# Patient Record
Sex: Female | Born: 1985
Health system: Southern US, Community
[De-identification: ages and names within clinical notes are randomized; demographics above are authoritative.]

---

## 2017-09-01 DIAGNOSIS — Z8481 Family history of carrier of genetic disease: Secondary | ICD-10-CM | POA: Diagnosis not present

## 2017-09-01 DIAGNOSIS — Z31430 Encounter of female for testing for genetic disease carrier status for procreative management: Secondary | ICD-10-CM | POA: Diagnosis not present

## 2019-06-02 DIAGNOSIS — Z0000 Encounter for general adult medical examination without abnormal findings: Secondary | ICD-10-CM | POA: Diagnosis not present

## 2019-06-02 DIAGNOSIS — E663 Overweight: Secondary | ICD-10-CM | POA: Diagnosis not present

## 2019-06-29 DIAGNOSIS — E663 Overweight: Secondary | ICD-10-CM | POA: Diagnosis not present

## 2019-06-29 DIAGNOSIS — M549 Dorsalgia, unspecified: Secondary | ICD-10-CM | POA: Diagnosis not present

## 2019-09-08 DIAGNOSIS — Z23 Encounter for immunization: Secondary | ICD-10-CM | POA: Diagnosis not present

## 2019-09-08 DIAGNOSIS — E782 Mixed hyperlipidemia: Secondary | ICD-10-CM | POA: Diagnosis not present

## 2019-09-08 DIAGNOSIS — Z6825 Body mass index (BMI) 25.0-25.9, adult: Secondary | ICD-10-CM | POA: Diagnosis not present

## 2019-09-18 DIAGNOSIS — Z03818 Encounter for observation for suspected exposure to other biological agents ruled out: Secondary | ICD-10-CM | POA: Diagnosis not present

## 2020-07-18 DIAGNOSIS — E782 Mixed hyperlipidemia: Secondary | ICD-10-CM | POA: Diagnosis not present

## 2020-11-08 ENCOUNTER — Other Ambulatory Visit: Payer: Self-pay

## 2020-11-08 ENCOUNTER — Ambulatory Visit (INDEPENDENT_AMBULATORY_CARE_PROVIDER_SITE_OTHER): Payer: Self-pay

## 2020-11-08 DIAGNOSIS — Z23 Encounter for immunization: Secondary | ICD-10-CM

## 2021-01-15 DIAGNOSIS — F411 Generalized anxiety disorder: Secondary | ICD-10-CM | POA: Diagnosis not present

## 2021-02-19 DIAGNOSIS — F411 Generalized anxiety disorder: Secondary | ICD-10-CM | POA: Diagnosis not present

## 2021-05-21 ENCOUNTER — Emergency Department (HOSPITAL_COMMUNITY): Payer: BLUE CROSS/BLUE SHIELD

## 2021-05-21 ENCOUNTER — Emergency Department (HOSPITAL_COMMUNITY)
Admission: EM | Admit: 2021-05-21 | Discharge: 2021-05-23 | Disposition: A | Discharge status: Home / Self Care | Payer: BLUE CROSS/BLUE SHIELD | Attending: Emergency Medicine | Admitting: Emergency Medicine

## 2021-05-21 ENCOUNTER — Encounter (HOSPITAL_COMMUNITY): Payer: Self-pay | Admitting: *Deleted

## 2021-05-21 ENCOUNTER — Ambulatory Visit (HOSPITAL_COMMUNITY)
Admission: AD | Admit: 2021-05-21 | Discharge: 2021-05-21 | Disposition: A | Discharge status: Home / Self Care | Payer: BC Managed Care – PPO | Attending: Psychiatry | Admitting: Psychiatry

## 2021-05-21 DIAGNOSIS — F29 Unspecified psychosis not due to a substance or known physiological condition: Secondary | ICD-10-CM | POA: Insufficient documentation

## 2021-05-21 DIAGNOSIS — F333 Major depressive disorder, recurrent, severe with psychotic symptoms: Secondary | ICD-10-CM | POA: Insufficient documentation

## 2021-05-21 DIAGNOSIS — I639 Cerebral infarction, unspecified: Secondary | ICD-10-CM | POA: Diagnosis not present

## 2021-05-21 DIAGNOSIS — N9489 Other specified conditions associated with female genital organs and menstrual cycle: Secondary | ICD-10-CM | POA: Diagnosis not present

## 2021-05-21 DIAGNOSIS — G9340 Encephalopathy, unspecified: Secondary | ICD-10-CM | POA: Diagnosis not present

## 2021-05-21 DIAGNOSIS — R000 Tachycardia, unspecified: Secondary | ICD-10-CM | POA: Diagnosis not present

## 2021-05-21 DIAGNOSIS — B9689 Other specified bacterial agents as the cause of diseases classified elsewhere: Secondary | ICD-10-CM | POA: Diagnosis not present

## 2021-05-21 DIAGNOSIS — E860 Dehydration: Secondary | ICD-10-CM | POA: Insufficient documentation

## 2021-05-21 DIAGNOSIS — N390 Urinary tract infection, site not specified: Secondary | ICD-10-CM | POA: Diagnosis not present

## 2021-05-21 DIAGNOSIS — Z20822 Contact with and (suspected) exposure to covid-19: Secondary | ICD-10-CM | POA: Diagnosis not present

## 2021-05-21 DIAGNOSIS — F419 Anxiety disorder, unspecified: Secondary | ICD-10-CM | POA: Diagnosis not present

## 2021-05-21 DIAGNOSIS — R0789 Other chest pain: Secondary | ICD-10-CM | POA: Diagnosis not present

## 2021-05-21 DIAGNOSIS — R079 Chest pain, unspecified: Secondary | ICD-10-CM

## 2021-05-21 DIAGNOSIS — G4700 Insomnia, unspecified: Secondary | ICD-10-CM | POA: Diagnosis not present

## 2021-05-21 LAB — CBC WITH DIFFERENTIAL/PLATELET
Abs Immature Granulocytes: 0.02 10*3/uL (ref 0.00–0.07)
Basophils Absolute: 0 10*3/uL (ref 0.0–0.1)
Basophils Relative: 0 %
Eosinophils Absolute: 0 10*3/uL (ref 0.0–0.5)
Eosinophils Relative: 0 %
HCT: 42.9 % (ref 36.0–46.0)
Hemoglobin: 14.4 g/dL (ref 12.0–15.0)
Immature Granulocytes: 0 %
Lymphocytes Relative: 14 %
Lymphs Abs: 1.3 10*3/uL (ref 0.7–4.0)
MCH: 31.1 pg (ref 26.0–34.0)
MCHC: 33.6 g/dL (ref 30.0–36.0)
MCV: 92.7 fL (ref 80.0–100.0)
Monocytes Absolute: 0.7 10*3/uL (ref 0.1–1.0)
Monocytes Relative: 8 %
Neutro Abs: 7.5 10*3/uL (ref 1.7–7.7)
Neutrophils Relative %: 78 %
Platelets: 174 10*3/uL (ref 150–400)
RBC: 4.63 MIL/uL (ref 3.87–5.11)
RDW: 11.9 % (ref 11.5–15.5)
WBC: 9.7 10*3/uL (ref 4.0–10.5)
nRBC: 0 % (ref 0.0–0.2)

## 2021-05-21 LAB — COMPREHENSIVE METABOLIC PANEL
ALT: 13 U/L (ref 0–44)
AST: 15 U/L (ref 15–41)
Albumin: 4.1 g/dL (ref 3.5–5.0)
Alkaline Phosphatase: 36 U/L — ABNORMAL LOW (ref 38–126)
Anion gap: 11 (ref 5–15)
BUN: 11 mg/dL (ref 6–20)
CO2: 21 mmol/L — ABNORMAL LOW (ref 22–32)
Calcium: 9.5 mg/dL (ref 8.9–10.3)
Chloride: 104 mmol/L (ref 98–111)
Creatinine, Ser: 0.8 mg/dL (ref 0.44–1.00)
GFR, Estimated: >60 mL/min (ref >60)
Glucose, Bld: 104 mg/dL — ABNORMAL HIGH (ref 70–99)
Potassium: 3.4 mmol/L — ABNORMAL LOW (ref 3.5–5.1)
Sodium: 136 mmol/L (ref 135–145)
Total Bilirubin: 1.5 mg/dL — ABNORMAL HIGH (ref 0.3–1.2)
Total Protein: 7.1 g/dL (ref 6.5–8.1)

## 2021-05-21 LAB — TROPONIN I (HIGH SENSITIVITY): Troponin I (High Sensitivity): 2 ng/L (ref <18)

## 2021-05-21 NOTE — BH Assessment (Addendum)
TTS note:   Kerry Dixon is a 35 y.o. female that presents to [facility] as a walk-in with her mother Cheryln Balcom). Clinician and [facility] provider Clydie Braun Dolby,NP went to see patient together to complete her TTS assessment. Patient's chief complaint was reported as "Chest Pain and not sleeping x4 days". The [facility] provider stepped in to address patient's medical concerns and/or complaints of "chest pain". Following the [facility] providers assessment of medical concerns it was determined that patient needed to to be referred to the ED for medical clearance. The provided discussed rationale and need for medical clearance. Patient and mother agreed. Patient transferred to [facility] for medical clearance. No TTS completed at this time as patient's medical concerns are more prevalent and will need to be addressed further.

## 2021-05-21 NOTE — H&P (Signed)
Behavioral Health Medical Screening Exam  Kerry Dixon is a 35 y.o. female seen by this provider with TTS counselor as voluntary walk-in at [facility] accompanied by her mother, Jasmine December. Mother is present during exam.  Patient is sitting calmly in exam room, cooperative with exam. When asked what brings her in today, she replies, "my chest is hurting and I haven't slept in 4 days" Mother reports patient was seen at her PCP today for chest pain with no work up. Symptoms include: abdominal pain, chest pain, exhaustion, night terrors and visions of people. PCP did not perform EKG or lab work today. Psychiatric evaluation stopped to discuss needing to get patient medically cleared.   Total Time spent with patient: 30 minutes  Psychiatric Specialty Exam:  Presentation  General Appearance:  Appropriate for Environment Eye Contact: Fair Speech: Clear and Coherent; Normal Rate Speech Volume: Normal Handedness: No data recorded  Mood and Affect  Mood: Anxious Affect: Congruent  Thought Process  Thought Processes: Coherent; Goal Directed Descriptions of Associations:Intact Orientation:Full (Time, Place and Person) Thought Content:Logical History of Schizophrenia/Schizoaffective disorder:No data recorded Duration of Psychotic Symptoms:No data recorded Hallucinations:Hallucinations: -- (full psychiatric evaluation on hold due to medical needs) Ideas of Reference:No data recorded Suicidal Thoughts:No data recorded Homicidal Thoughts:No data recorded  Sensorium  Memory: No data recorded Judgment: No data recorded Insight: No data recorded  Executive Functions  Concentration: No data recorded Attention Span: No data recorded Recall: No data recorded Fund of Knowledge: No data recorded Language: No data recorded  Psychomotor Activity  Psychomotor Activity: No data recorded  Assets  Assets: No data recorded  Sleep  Sleep: No data recorded   Physical Exam: Physical  Exam Constitutional:      General: She is not in acute distress. Musculoskeletal:        General: Normal range of motion.  Skin:    General: Skin is warm and dry.  Neurological:     Mental Status: She is alert and oriented to person, place, and time.  Psychiatric:        Attention and Perception: Attention normal.        Mood and Affect: Mood is anxious.        Speech: Speech normal.        Behavior: Behavior is cooperative.   Review of Systems  Constitutional:  Negative for chills and fever.  Respiratory:  Negative for shortness of breath.   Cardiovascular:  Positive for chest pain.  Gastrointestinal:  Positive for abdominal pain. Negative for nausea and vomiting.  Neurological:  Negative for weakness and headaches.  Blood pressure 122/89, pulse 98, temperature 98.4 F (36.9 C), temperature source Oral, resp. rate 16, SpO2 99 %. There is no height or weight on file to calculate BMI.  Musculoskeletal: Strength & Muscle Tone: within normal limits Gait & Station: normal Patient leans: N/A   Recommendations:  Based on my evaluation the patient appears to have an emergency medical condition for which I recommend the patient be transferred to the emergency department for further evaluation. Due to possible cardiac involvement, patient will be transported via safe transport to [facility] hospital. Charge nurse notified and Dr. Particia Nearing, EDP aware and accepts patient. Once medically cleared, please place TTS consult for full psychiatric evaluation. Patient and mother agree with plan to go to [facility] for medical evaluation. EMTALA form completed.   Novella Olive, NP 05/21/2021, 5:23 PM

## 2021-05-21 NOTE — ED Provider Notes (Addendum)
Emergency Medicine Provider Triage Evaluation Note  Kerry Dixon , a 35 y.o. female  was evaluated in triage.  Pt complains of "feeling very tired" since Sunday. She endorses runny nose, sore throat, nausea, diarrhea, abdominal pain. She says yes to chest pain, denies vomiting. Patient reports she takes some medication for anxiety and took two today.   Addendum: Patient was apparently sent from [facility] for medical clearance because she endorses chest pain, shortness of breath.  Review of Systems  Positive: As above Negative: As above  Physical Exam  BP 121/90 (BP Location: Right Arm)   Pulse 91   Temp 99.5 F (37.5 C) (Oral)   Resp 16   SpO2 98%  Gen:   Awake, no distress, somnolent Resp:  Normal effort MSK:   Moves extremities without difficulty Other:  No/minimal TTP of abdomen  Medical Decision Making  Medically screening exam initiated at 6:48 PM.  Appropriate orders placed.  Kerry Dixon was informed that the remainder of the evaluation will be completed by another provider, this initial triage assessment does not replace that evaluation, and the importance of remaining in the ED until their evaluation is complete.  fatigue   Olene Floss, PA-C 05/21/21 1850    Kerry Dixon 05/21/21 1904    Pollyann Savoy, MD 05/22/21 [phone]

## 2021-05-21 NOTE — ED Triage Notes (Signed)
Tired and sleepy since  Sunday

## 2021-05-21 NOTE — ED Triage Notes (Signed)
Most of the information not completed  the pt is not answering any questions asked  unable to complete triage

## 2021-05-21 NOTE — ED Triage Notes (Signed)
Pt is not answering questions she told the pa that she is hurting all over  no answers to questions asked sleeps  lmp  no answer

## 2021-05-21 NOTE — ED Notes (Signed)
Pt will not sit still and does not want vitals done

## 2021-05-22 ENCOUNTER — Emergency Department (HOSPITAL_COMMUNITY): Payer: BLUE CROSS/BLUE SHIELD

## 2021-05-22 DIAGNOSIS — G9340 Encephalopathy, unspecified: Secondary | ICD-10-CM | POA: Diagnosis not present

## 2021-05-22 DIAGNOSIS — I639 Cerebral infarction, unspecified: Secondary | ICD-10-CM | POA: Diagnosis not present

## 2021-05-22 LAB — SALICYLATE LEVEL: Salicylate Lvl: <7 mg/dL — ABNORMAL LOW (ref 7.0–30.0)

## 2021-05-22 LAB — URINALYSIS, ROUTINE W REFLEX MICROSCOPIC
Bilirubin Urine: NEGATIVE
Glucose, UA: NEGATIVE mg/dL
Hgb urine dipstick: NEGATIVE
Ketones, ur: 80 mg/dL — AB
Nitrite: NEGATIVE
Protein, ur: 30 mg/dL — AB
Specific Gravity, Urine: 1.027 (ref 1.005–1.030)
pH: 5 (ref 5.0–8.0)

## 2021-05-22 LAB — RESP PANEL BY RT-PCR (FLU A&B, COVID) ARPGX2
Influenza A by PCR: NEGATIVE
Influenza B by PCR: NEGATIVE
SARS Coronavirus 2 by RT PCR: NEGATIVE

## 2021-05-22 LAB — TSH: TSH: 1.221 u[IU]/mL (ref 0.350–4.500)

## 2021-05-22 LAB — ACETAMINOPHEN LEVEL: Acetaminophen (Tylenol), Serum: <10 ug/mL — ABNORMAL LOW (ref 10–30)

## 2021-05-22 LAB — ETHANOL: Alcohol, Ethyl (B): <10 mg/dL (ref <10)

## 2021-05-22 LAB — TROPONIN I (HIGH SENSITIVITY): Troponin I (High Sensitivity): 3 ng/L (ref <18)

## 2021-05-22 LAB — POC URINE PREG, ED: Preg Test, Ur: NEGATIVE

## 2021-05-22 MED ORDER — CEPHALEXIN 250 MG PO CAPS
500.0000 mg | ORAL_CAPSULE | Freq: Two times a day (BID) | ORAL | Status: DC
  Administered 2021-05-22 (×2): 500 mg via ORAL
  Filled 2021-05-22 (×2): qty 2

## 2021-05-22 MED ORDER — ZIPRASIDONE MESYLATE 20 MG IM SOLR
INTRAMUSCULAR | Status: AC
  Administered 2021-05-22: 20 mg via INTRAMUSCULAR
  Filled 2021-05-22: qty 20

## 2021-05-22 MED ORDER — SODIUM CHLORIDE 0.9 % IV BOLUS
1000.0000 mL | Freq: Once | INTRAVENOUS | Status: AC
  Administered 2021-05-22: 1000 mL via INTRAVENOUS

## 2021-05-22 MED ORDER — SODIUM CHLORIDE 0.9 % IV SOLN
1.0000 g | Freq: Once | INTRAVENOUS | Status: AC
  Administered 2021-05-22: 1 g via INTRAVENOUS
  Filled 2021-05-22: qty 10

## 2021-05-22 MED ORDER — MELATONIN 3 MG PO TABS
3.0000 mg | ORAL_TABLET | Freq: Every day | ORAL | Status: DC
  Administered 2021-05-22: 3 mg via ORAL
  Filled 2021-05-22: qty 1

## 2021-05-22 MED ORDER — STERILE WATER FOR INJECTION IJ SOLN
INTRAMUSCULAR | Status: AC
  Administered 2021-05-22: 1 mL via INTRAMUSCULAR
  Filled 2021-05-22: qty 10

## 2021-05-22 MED ORDER — HYDROXYZINE HCL 10 MG PO TABS
10.0000 mg | ORAL_TABLET | Freq: Two times a day (BID) | ORAL | Status: DC
  Administered 2021-05-22: 10 mg via ORAL
  Filled 2021-05-22 (×3): qty 1

## 2021-05-22 NOTE — Progress Notes (Addendum)
Inpatient Behavioral Health Placement.   Pt meets inpatient criteria per [facility].  Referral was sent to the following facilities:  Destination Service Provider Address Phone Fax  [facility]  [address] [place] [address] [phone]  [facility]  [address] [place] [address] [phone]  [facility]  [address] [place] [address] [phone]  [facility]  [address], [place] [address] [phone]  [facility]  [address] [place] [address] [phone]  [facility]  [address] [place], [place] [address] [phone]  [facility]  [address] [place] [address] [phone]  [facility]  [address], [place] [address] [phone]  [facility]  [address] [place] [address] [phone]  [facility]  [address], [place] [address] [phone]  [facility]  [address] [place] [address] [phone]    Situation ongoing,  CSW will follow up.   Maryjean Ka, MSW, [facility] 05/22/2021  @ 6:49 PM

## 2021-05-22 NOTE — ED Notes (Signed)
Pts AD placed with other belongings, not completed as witnesses are not present. Pt verbalized need for something to help her sleep. This RN spoke with Wilkie Aye, MD about this issue.

## 2021-05-22 NOTE — ED Notes (Signed)
Chaplain at bedside

## 2021-05-22 NOTE — ED Notes (Signed)
Pt changed into purple scrubs. Belongings inventoried and placed in locker #4

## 2021-05-22 NOTE — ED Notes (Signed)
Pt up to bathroom with sister.

## 2021-05-22 NOTE — ED Notes (Signed)
Pt asking to speak with chaplain, this RN notified chaplain of desire to do so.

## 2021-05-22 NOTE — ED Notes (Signed)
This RN rounded on pt, pt stated she just felt tired.

## 2021-05-22 NOTE — ED Provider Notes (Signed)
[facility] EMERGENCY DEPARTMENT Provider Note   CSN: [id number] Arrival date & time: 05/21/21  1829     History Chief Complaint  Patient presents with   Fatigue    Kerry Dixon is a 35 y.o. female presents to the emergency department accompanied by her sister from [facility].  Sister reports that patient has been anxious and not sleeping for the last 4 nights.  Patient saw her primary care physician in April for anxiety and was prescribed BuSpar.  Patient reports she did not take any of that medication until 4 days ago and has not taken any since.  Patient had a follow-up with her primary care this morning and was given hydroxyzine with a plan to stop the BuSpar.  Patient has had 2 tablets of hydroxyzine without improvement in anxiety.  Patient was taken to [facility] for further evaluation but complained of chest pain there.  They deferred psychiatric evaluation until patient could be medically cleared.  Patient reports that her chest feels tight but is not painful.  She reports that she feels anxious and very tired and is hungry.  She does not seem to be aware of her situation.  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  Denies homicidal ideation or suicidal ideation.  Patient's sister reports that she has been having some potential hallucinations and is paranoid that other people are getting hurt.  No known aggravating or alleviating factors.  Denies drug and alcohol usage.    The history is provided by the patient and medical records. No language interpreter was used.      History reviewed. No pertinent past medical history.  There are no problems to display for this patient.   History reviewed. No pertinent surgical history.   OB History   No obstetric history on file.     History reviewed. No pertinent family history.     Home Medications Prior to Admission medications   Medication Sig Start Date End Date  Taking? Authorizing Provider  ASHWAGANDHA PO Take 1 capsule by mouth daily.   Yes [provider]  hydrOXYzine (ATARAX/VISTARIL) 10 MG tablet Take 10 mg by mouth 2 (two) times daily. 05/21/21  Yes [provider]  buPROPion (WELLBUTRIN) 75 MG tablet Take 75 mg by mouth 2 (two) times daily. Patient not taking: No sig reported 02/19/21   [provider]    Allergies    Patient has no known allergies.  Review of Systems   Review of Systems  Constitutional:  Positive for fatigue. Negative for appetite change, diaphoresis, fever and unexpected weight change.  HENT:  Negative for mouth sores.   Eyes:  Negative for visual disturbance.  Respiratory:  Positive for chest tightness. Negative for cough, shortness of breath and wheezing.   Cardiovascular:  Positive for palpitations. Negative for chest pain.  Gastrointestinal:  Negative for abdominal pain, constipation, diarrhea, nausea and vomiting.  Endocrine: Negative for polydipsia, polyphagia and polyuria.  Genitourinary:  Negative for dysuria, frequency, hematuria and urgency.  Musculoskeletal:  Negative for back pain and neck stiffness.  Skin:  Negative for rash.  Allergic/Immunologic: Negative for immunocompromised state.  Neurological:  Negative for syncope, light-headedness and headaches.  Hematological:  Does not bruise/bleed easily.  Psychiatric/Behavioral:  Positive for decreased concentration and sleep disturbance. The patient is nervous/anxious.    Physical Exam Updated Vital Signs BP 118/70   Pulse (!) 107   Temp 99.5 F (37.5 C) (  Oral)   Resp 18   Ht 5\' 4"  (1.626 m)   Wt 59 kg   SpO2 98%   BMI 22.31 kg/m   Physical Exam Vitals and nursing note reviewed.  Constitutional:      General: She is not in acute distress.    Appearance: She is not diaphoretic.  HENT:     Head: Normocephalic.  Eyes:     General: No scleral icterus.    Conjunctiva/sclera: Conjunctivae normal.  Cardiovascular:      Rate and Rhythm: Regular rhythm. Tachycardia present.     Pulses: Normal pulses.          Radial pulses are 2+ on the right side and 2+ on the left side.  Pulmonary:     Effort: No tachypnea, accessory muscle usage, prolonged expiration, respiratory distress or retractions.     Breath sounds: No stridor.     Comments: Equal chest rise. No increased work of breathing. Abdominal:     General: There is no distension.     Palpations: Abdomen is soft.     Tenderness: There is no abdominal tenderness. There is no guarding or rebound.  Musculoskeletal:     Cervical back: Normal range of motion.     Comments: Moves all extremities equally and without difficulty.  Skin:    General: Skin is warm and dry.     Capillary Refill: Capillary refill takes less than 2 seconds.  Neurological:     Mental Status: She is alert.     GCS: GCS eye subscore is 4. GCS verbal subscore is 5. GCS motor subscore is 6.     Comments: Speech is clear and goal oriented.  Psychiatric:        Mood and Affect: Mood is anxious.        Behavior: Behavior is cooperative.        Judgment: Judgment is impulsive.    ED Results / Procedures / Treatments   Labs (all labs ordered are listed, but only abnormal results are displayed) Labs Reviewed  COMPREHENSIVE METABOLIC PANEL - Abnormal; Notable for the following components:      Result Value   Potassium 3.4 (*)    CO2 21 (*)    Glucose, Bld 104 (*)    Alkaline Phosphatase 36 (*)    Total Bilirubin 1.5 (*)    All other components within normal limits  ACETAMINOPHEN LEVEL - Abnormal; Notable for the following components:   Acetaminophen (Tylenol), Serum <10 (*)    All other components within normal limits  SALICYLATE LEVEL - Abnormal; Notable for the following components:   Salicylate Lvl <7.0 (*)    All other components within normal limits  URINALYSIS, ROUTINE W REFLEX MICROSCOPIC - Abnormal; Notable for the following components:   APPearance HAZY (*)    Ketones,  ur 80 (*)    Protein, ur 30 (*)    Leukocytes,Ua MODERATE (*)    Bacteria, UA RARE (*)    All other components within normal limits  RESP PANEL BY RT-PCR (FLU A&B, COVID) ARPGX2  CBC WITH DIFFERENTIAL/PLATELET  ETHANOL  TSH  RAPID URINE DRUG SCREEN, HOSP PERFORMED  I-STAT BETA HCG BLOOD, ED ([facility], [facility], [facility] ONLY)  TROPONIN I (HIGH SENSITIVITY)  TROPONIN I (HIGH SENSITIVITY)    EKG EKG Interpretation  Date/Time:  Wednesday May 21 2021 19:08:11 EDT Ventricular Rate:  88 PR Interval:  110 QRS Duration: 62 QT Interval:  366 QTC Calculation: 442 R Axis:   27 Text Interpretation: Sinus  rhythm with sinus arrhythmia Confirmed by Nicanor Alcon, April ([id number]) on 05/21/2021 11:28:01 PM  Radiology DG Chest 1 View  Result Date: 05/21/2021 CLINICAL DATA:  Chest pain. EXAM: CHEST  1 VIEW COMPARISON:  None. FINDINGS: The heart size and mediastinal contours are within normal limits. Both lungs are clear. The visualized skeletal structures are unremarkable. IMPRESSION: No active disease. Electronically Signed   By: Aram Candela M.D.   On: 05/21/2021 20:54   CT HEAD WO CONTRAST ( )  Result Date: 05/22/2021 CLINICAL DATA:  Encephalopathy EXAM: CT HEAD WITHOUT CONTRAST TECHNIQUE: Contiguous axial images were obtained from the base of the skull through the vertex without intravenous contrast. COMPARISON:  None. FINDINGS: Brain: There is no mass, hemorrhage or extra-axial collection. The size and configuration of the ventricles and extra-axial CSF spaces are normal. The brain parenchyma is normal, without acute or chronic infarction. Vascular: No abnormal hyperdensity of the major intracranial arteries or dural venous sinuses. No intracranial atherosclerosis. Skull: The visualized skull base, calvarium and extracranial soft tissues are normal. Sinuses/Orbits: No fluid levels or advanced mucosal thickening of the visualized paranasal sinuses. No mastoid or middle ear effusion. The orbits are normal.  IMPRESSION: Normal head CT. Electronically Signed   By: Deatra Robinson M.D.   On: 05/22/2021 03:35    Procedures Procedures   Medications Ordered in ED Medications  cephALEXin (KEFLEX) capsule 500 mg (has no administration in time range)  sodium chloride 0.9 % bolus 1,000 mL (0 mLs Intravenous Stopped 05/22/21 0325)  cefTRIAXone (ROCEPHIN) 1 g in sodium chloride 0.9 % 100 mL IVPB (0 g Intravenous Stopped 05/22/21 0301)  ziprasidone (GEODON) 20 MG injection (20 mg Intramuscular Given 05/22/21 0415)  sterile water (preservative free) injection (1 mL Intramuscular Given 05/22/21 0415)    ED Course  I have reviewed the triage vital signs and the nursing notes.  Pertinent labs & imaging results that were available during my care of the patient were reviewed by me and considered in my medical decision making (see chart for details).    MDM Rules/Calculators/A&P                           Patient presents with chest tightness.  Tachycardia noted on clinical exam.  Patient is anxious and impulsive but otherwise physical exam is reassuring.  Labs and work-up pending for medical clearance.  5:28 AM Labs are overall reassuring.  Patient with 80 ketones in her urine, moderate leukocytes and rare bacteria with a few white blood cells.  Will treat for urinary tract infection however I do not think this is the cause of her altered mental status.  Other labs are reassuring.  COVID-negative.  TSH within normal limits.  CT head without acute abnormality including mass-effect or intracranial hemorrhage.  Discussed findings with patient and sister.  Patient continues to be more agitated and psychotic.  She became aggressive, running from the room and required Geodon for sedation.  She is pending TTS but will likely need inpatient treatment.   Final Clinical Impression(s) / ED Diagnoses Final diagnoses:  Psychosis, unspecified psychosis type (HCC)  Dehydration  Urinary tract infection without hematuria, site  unspecified    Rx / DC Orders ED Discharge Orders     None        Aslyn Cottman, Boyd Kerbs 05/22/21 0529    Palumbo, April, MD 05/22/21 [phone]
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
## 2021-05-22 NOTE — ED Notes (Signed)
Pt appears very anxious at this time, verbalizes feelings of anxiety. This RN spoke with pt about ways to help anxiety. Pt given book from purple zone to read. Dinner tray at bedside. Pt cooperative.

## 2021-05-22 NOTE — ED Notes (Signed)
Pt sleeping at this time.

## 2021-05-22 NOTE — Progress Notes (Signed)
   05/22/21 0017  Clinical Encounter Type  Visited With Patient and family together  Visit Type Initial;ED;Spiritual support  Referral From Nurse  Consult/Referral To Chaplain   Chaplain responded. Consult with the nurse, who stated the patient's sister requested to see the Chaplain. The patient appeared to be sleep; however, the female sitting outside the room identified herself; as the patient's sister. She said, the patient would need an Proofreader. This Chaplain provided AD education. Advised her to have nurse contact Spiritual Care dept when the patient is awake and alert to inform Chaplain who she wants to appoint as her HCPOA. Prayer offered. This note was prepared by Deneen Harts, M.Div..  For questions please contact by phone [phone].

## 2021-05-22 NOTE — ED Notes (Signed)
Pt speaking with TTS at this time.  

## 2021-05-22 NOTE — ED Notes (Signed)
Pt resting at this time. Visible chest rise and fall noted. Pt appears in NAD.  

## 2021-05-22 NOTE — ED Notes (Signed)
Pt continues to rest at this time. Visible rise and fall of chest noted.

## 2021-05-22 NOTE — ED Notes (Addendum)
Pt eating lunch at this time. States she feels much better after getting some rest.

## 2021-05-22 NOTE — BH Assessment (Signed)
@  1000, requested patient's nurse (Camryn Cogan, RN), to place the TTS machine in patient's room for a re-assessment. Per Camryn, "she got 20 of geodon at around 4am and is still sleeping from that".   Clinician requested Camryn to notify TTS when patient is available to be re-assessed.

## 2021-05-22 NOTE — Progress Notes (Signed)
Visited with patient per staff and patient request. Patient want AD,  AD was provided as well as emotional and spiritual support.  Provided prayer per patient request..  Will follow as needed.  Venida Jarvis, [place], [facility], Pager [phone]

## 2021-05-22 NOTE — ED Notes (Addendum)
Pt resting comfortably at this time, equal rise and fall of chest noted, pt awake upon entering room, pt requests Melatonin, EDP made aware

## 2021-05-22 NOTE — BH Assessment (Signed)
Comprehensive Clinical Assessment (CCA) Note  05/22/2021 Kerry Dixon [id number]  Disposition: TTS completed. Per [facility] Provider Kerry Nixon, NP), patient recommended for inpatient treatment. Disposition CSW provided disposition updates and to seek appropriate placement. Also, patient's nurse Carmryn, NP provided disposition updates. [facility] AC notified of patient's bed needs. [place]-SUICIDE SEVERITY RATING SCALE (C-SSRS) completed and patient scored, "No Risk". Therefore, no 1-1 sitter precautions recommended at this time.   The patient demonstrates the following risk factors for suicide: Chronic risk factors for suicide include: psychiatric disorder of Major Depressive Disorder, Recurrent, Severe with Psychotic Features; Rule out Acute Psychosis; Anxiety Disorder . 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 Protective factors for this patient include: responsibility to others (children, family) and "My son, I could never do that to my son" . Considering these factors, the overall suicide risk at this point appears to be "No Risk". Patient is not appropriate for outpatient follow up.  Flowsheet Row ED from 05/21/2021 in [facility] EMERGENCY DEPARTMENT  C-SSRS RISK CATEGORY No Risk       Chief Complaint:  Chief Complaint  Patient presents with   Fatigue   Psychiatric Evaluation   Visit Diagnosis: Major Depressive Disorder, Recurrent, Severe with Psychotic Features; Rule out Acute Psychosis; Anxiety Disorder .  Kerry Dixon is Dixon 35 y.o. female with the complaint: "My heart is beating so  fast and I think it's anxiety". Further reporting and increased in depressive symptoms. Onset of symptoms started 1 year ago." Patient initially presented to [facility] as walking 05/21/2021 and was send to the Emergency Department for medical clearance after verbalizing chest pain.  Patient feels that her symptoms are triggered by  "Work, being Dixon mom, everything". She works as Dixon Building control surveyor. She also has Dixon son who is 66 yrs old.   She had an appointment with PCP yesterday, prior to presenting to [facility] as Dixon walk in, and was prescribed anti anxiety medications to help manage her symptoms as noted above. She identifies her PCP as Kerry Dixon), Patient prescribed Hydrozine. However, hasn't had an opportunity to start those medications. She also recalls being prescribed another medication several months ago to assist with her anxiety, doesn't recall the name of the medication prescribed. However, says she didn't really take this unknown medication because it made her sleepy. She has no history of receiving care from Dixon psychiatrist and/or therapist. No history of inpatient psychiatric treatment.   Denies current suicidal ideations. She denies recent/past suicidal ideations. Protective factor includes her 46 yr old son stating, "I love my son to much to do that". Denies history of self-mutilating behaviors. Patient with current depressive symptoms such as loss of interest in usual pleasures, guilt, anger/irritability, tearful, fatigue, and insomnia. States that in the past 4 days her sleep has remained poor, averaging 1-2 hours of sleep per night. Appetite has been poor and hasn't eaten much this week. No significant weight loss and/or gain.   Patient denies homicidal ideations. Denies history of aggressive and/or assaultive behaviors. Denies legal issues, court dates pending, and she is not on probation.  She was asked about auditory and visual hallucinations. She replies, "I see visions, seeing bad visions of things that she feels may happen to people". For example: "Sometimes I will see Dixon bad car wreck as Dixon vision and the next day the car wreck actually happens". Denies feelings of paranoia. he does report social use of alcohol. States, "I  occasionally drink wine". Also, takes CBD gummies 3x's per week.   Patient lives in Dixon  household with her 62 yr old son and her long term boyfriend. Highest level of education is Dixon associate's degree. Current support system includes, "My entire family and work family". Hobbies include: "Spending time with my children". She denies that she exercises. Raised by both parents.   Patient provides verbal consent to speak to her sister Kerry Dixon) [phone] to obtain collateral details.  The sister states that she received Dixon call on Tuesday from Kerry Dixon's long-time live in boyfriend. The boyfriend shared his concerns stating called sting that he had concerns. Stating that Kerry Dixon had not slept in several days. Also, stating that people she knew would be getting into Dixon care accident. Patient further making calls to family members and/or friends throughout the day to check on them. Reportedly fearful that they were involved in Dixon car accident. The sister says that patients 67 y/o niece passed away in 2013/01/29. Recently patient stated to family that this was the start of her symptoms. Since the nieces death other life situations have occurred. The sister feels that the situations has caused Dixon greater stress on patient.  Kerry Dixon son was dx's with "Brittle Bone Disease". Therefore, patient is very "clingy" with her son. He just started school. States that patient has been very stressed about her son starting school fearful that he will be harmed due to his diagnosis. The sister fears that the worrying may contribute to some of patient's anxiety and stress.  Patient also verbalized to her sister that she took multiple unknown pills, 2-3 days ago as an overdose. However, the sister does not believe what patient did this because she seemed to be ok and did not take any pills. According to the sister she was with patient all day long and doesn't believe she had any access to pills.  Today her sister says that she looked in patient's phone and found notes written by Kerry Dixon with the names of the children at  the school. Patient wrote in the notes that she plans to baptize the children so that they can go home to be the [place]. The sister is very concerned about these written notes and believes Kenlynn needs further psychiatric services to monitor her symptoms.   CCA Screening, Triage and Referral (STR)  Patient Reported Information How did you hear about [place]? No data recorded What Is the Reason for Your Visit/Call Today? No data recorded How Long Has This Been Causing You Problems? No data recorded What Do You Feel Would Help You the Most Today? No data recorded  Have You Recently Had Any Thoughts About Hurting Yourself? No data recorded Are You Planning to Commit Suicide/Harm Yourself At This time? No data recorded  Have you Recently Had Thoughts About Hurting Someone Karolee Ohs? No data recorded Are You Planning to Harm Someone at This Time? No data recorded Explanation: No data recorded  Have You Used Any Alcohol or Drugs in the Past 24 Hours? No data recorded How Long Ago Did You Use Drugs or Alcohol? No data recorded What Did You Use and How Much? No data recorded  Do You Currently Have Dixon Therapist/Psychiatrist? No data recorded Name of Therapist/Psychiatrist: No data recorded  Have You Been Recently Discharged From Any Office Practice or Programs? No data recorded Explanation of Discharge From Practice/Program: No data recorded    CCA Screening Triage Referral Assessment Type of Contact: No data recorded Telemedicine Service Delivery:  Is this Initial or Reassessment? No data recorded Date Telepsych consult ordered in [facility]:  No data recorded Time Telepsych consult ordered in [facility]:  No data recorded Location of Assessment: No data recorded Provider Location: No data recorded  Collateral Involvement: No data recorded  Does Patient Have [place] Court Appointed Legal Guardian? No data recorded Name and Contact of Legal Guardian: No data recorded If Minor and Not Living with Parent(s), Who has  Custody? No data recorded Is CPS involved or ever been involved? No data recorded Is APS involved or ever been involved? No data recorded  Patient Determined To Be At Risk for Harm To Self or Others Based on Review of Patient Reported Information or Presenting Complaint? No data recorded Method: No data recorded Availability of Means: No data recorded Intent: No data recorded Notification Required: No data recorded Additional Information for Danger to Others Potential: No data recorded Additional Comments for Danger to Others Potential: No data recorded Are There Guns or Other Weapons in Your Home? No data recorded Types of Guns/Weapons: No data recorded Are These Weapons Safely Secured?                            No data recorded Who Could Verify You Are Able To Have These Secured: No data recorded Do You Have any Outstanding Charges, Pending Court Dates, Parole/Probation? No data recorded Contacted To Inform of Risk of Harm To Self or Others: No data recorded   Does Patient Present under Involuntary Commitment? No data recorded IVC Papers Initial File Date: No data recorded  [place] data recorded  Patient Currently Receiving the Following Services: No data recorded  Determination of Need: No data recorded  Options For Referral: No data recorded    CCA Biopsychosocial Patient Reported Schizophrenia/Schizoaffective Diagnosis in Past: No   Strengths: No data recorded  Mental Health Symptoms Depression:   Difficulty Concentrating; Hopelessness; Irritability; Worthlessness   Duration of Depressive symptoms:  Duration of Depressive Symptoms: Greater than two weeks   Mania:   None   Anxiety:    Irritability; Restlessness; Sleep; Tension; Difficulty concentrating; Worrying   Psychosis:   Affective flattening/alogia/avolition   Duration of Psychotic symptoms:  Duration of Psychotic Symptoms: Greater than six months   Trauma:   None   Obsessions:    None   Compulsions:   None   Inattention:  No data recorded  Hyperactivity/Impulsivity:   None   Oppositional/Defiant Behaviors:   None   Emotional Irregularity:   None; Mood lability   Other Mood/Personality Symptoms:  No data recorded   Mental Status Exam Appearance and self-care  Stature:   Average   Weight:   Average weight   Clothing:   Neat/clean   Grooming:   Normal   Cosmetic use:   None   Posture/gait:   Normal   Motor activity:   Not Remarkable   Sensorium  Attention:   Normal   Concentration:   Normal   Orientation:   Time; Situation; Place; Person; Object   Recall/memory:   Normal   Affect and Mood  Affect:   Appropriate; Depressed   Mood:   Depressed   Relating  Eye contact:   Normal   Facial expression:   Depressed   Attitude toward examiner:   Cooperative   Thought and Language  Speech flow:  Clear and Coherent   Thought content:   Appropriate to Mood and Circumstances   Preoccupation:  No data recorded  Hallucinations:   None   Organization:  No data recorded  [employer] of Knowledge:   Fair   Intelligence:   Average   Abstraction:   Normal   Judgement:   Impaired   Reality Testing:   Distorted   Insight:   Flashes of insight; Lacking; Gaps   Decision Making:   Confused   Social Functioning  Social Maturity:   Isolates   Social Judgement:   Heedless   Stress  Stressors:   Other (Comment); Work; Relationship   Coping Ability:   Normal   Skill Deficits:   Decision making   Supports:   Family     Religion: Religion/Spirituality Are You Dixon Religious Person?: No  Leisure/Recreation: Leisure / Recreation Do You Have Hobbies?: No  Exercise/Diet: Exercise/Diet Do You Exercise?: No Have You Gained or Lost Dixon Significant Amount of Weight in the Past Six Months?: No Do You Follow Dixon Special Diet?: No (States that for the past 4 days she has not been able to  sleep well, averaging 1-2 hours of sleep per night. Appetite has been poor. States that she hasn't eaten much this week. No significant weight loss and/or gain.) Do You Have Any Trouble Sleeping?: No (States that for the past 4 days she has not been able to sleep well, averaging 1-2 hours of sleep per night. Appetite has been poor. States that she hasn't eaten much this week. 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, Civil Service: Education Is Patient Currently Attending School?: No Did Theme park manager?: No Did You Have An Individualized Education Program (IIEP): No Did You Have Any Difficulty At [employer]?: No Patient's Education Has Been Impacted by Current Illness: No   CCA Family/Childhood History Family and Relationship History: Family history Marital status: Long term relationship Does patient have children?: Yes How many children?:  (5 yrs old) How is patient's relationship with their children?: Patient has Dixon great relationship with her children  Childhood History:  Childhood History By whom was/is the patient raised?: Both parents Did patient suffer any verbal/emotional/physical/sexual abuse as Dixon child?: No Did patient suffer from severe childhood neglect?: No Has patient ever been sexually abused/assaulted/raped as an adolescent or adult?: No Was the patient ever Dixon victim of Dixon crime or Dixon disaster?: No Witnessed domestic violence?: No Has patient been affected by domestic violence as an adult?: No  Child/Adolescent Assessment:     CCA Substance Use Alcohol/Drug Use: Alcohol / Drug Use Pain Medications: SEE MAR Prescriptions: SEE MAR Over the Counter: SEE  MAR History of alcohol / drug use?: Yes (CBD Gummies 3x's per week and recently started vaping.)                         ASAM's:  Six Dimensions of Multidimensional Assessment  Dimension 1:  Acute Intoxication and/or Withdrawal Potential:      Dimension 2:  Biomedical Conditions and Complications:      Dimension 3:  Emotional, Behavioral, or Cognitive Conditions and Complications:     Dimension 4:  Readiness to Change:     Dimension 5:  Relapse, Continued use, or Continued Problem Potential:     Dimension 6:  Recovery/Living Environment:  ASAM Severity Score:    ASAM Recommended Level of Treatment:     Substance use Disorder (SUD)    Recommendations for Services/Supports/Treatments: Recommendations for Services/Supports/Treatments Recommendations For Services/Supports/Treatments: Medication Management, Inpatient Hospitalization  Discharge Disposition:    DSM5 Diagnoses: There are no problems to display for this patient.    Referrals to Alternative Service(s): Referred to Alternative Service(s):   Place:   Date:   Time:    Referred to Alternative Service(s):   Place:   Date:   Time:    Referred to Alternative Service(s):   Place:   Date:   Time:    Referred to Alternative Service(s):   Place:   Date:   Time:     Melynda Ripple, Counselor

## 2021-05-22 NOTE — ED Notes (Signed)
Phone call from sister allowed

## 2021-05-23 ENCOUNTER — Encounter (HOSPITAL_COMMUNITY): Payer: Self-pay | Admitting: Behavioral Health

## 2021-05-23 ENCOUNTER — Encounter (HOSPITAL_COMMUNITY): Payer: Self-pay

## 2021-05-23 ENCOUNTER — Other Ambulatory Visit: Payer: Self-pay

## 2021-05-23 ENCOUNTER — Inpatient Hospital Stay (HOSPITAL_COMMUNITY)
Admission: AD | Admit: 2021-05-23 | Discharge: 2021-05-28 | DRG: 897 | Disposition: A | Discharge status: Home / Self Care | Payer: Federal, State, Local not specified - Other | Source: Intra-hospital | Attending: Emergency Medicine | Admitting: Emergency Medicine

## 2021-05-23 DIAGNOSIS — F411 Generalized anxiety disorder: Secondary | ICD-10-CM | POA: Diagnosis present

## 2021-05-23 DIAGNOSIS — F333 Major depressive disorder, recurrent, severe with psychotic symptoms: Secondary | ICD-10-CM | POA: Diagnosis present

## 2021-05-23 DIAGNOSIS — E7800 Pure hypercholesterolemia, unspecified: Secondary | ICD-10-CM | POA: Diagnosis present

## 2021-05-23 DIAGNOSIS — G4700 Insomnia, unspecified: Secondary | ICD-10-CM | POA: Diagnosis present

## 2021-05-23 DIAGNOSIS — F19232 Other psychoactive substance dependence with withdrawal with perceptual disturbance: Secondary | ICD-10-CM

## 2021-05-23 DIAGNOSIS — N390 Urinary tract infection, site not specified: Secondary | ICD-10-CM | POA: Diagnosis present

## 2021-05-23 DIAGNOSIS — F410 Panic disorder [episodic paroxysmal anxiety] without agoraphobia: Secondary | ICD-10-CM | POA: Diagnosis present

## 2021-05-23 DIAGNOSIS — F19959 Other psychoactive substance use, unspecified with psychoactive substance-induced psychotic disorder, unspecified: Secondary | ICD-10-CM | POA: Diagnosis present

## 2021-05-23 DIAGNOSIS — Z818 Family history of other mental and behavioral disorders: Secondary | ICD-10-CM

## 2021-05-23 DIAGNOSIS — E876 Hypokalemia: Secondary | ICD-10-CM | POA: Diagnosis present

## 2021-05-23 DIAGNOSIS — Z20822 Contact with and (suspected) exposure to covid-19: Secondary | ICD-10-CM | POA: Diagnosis present

## 2021-05-23 LAB — COMPREHENSIVE METABOLIC PANEL
ALT: 14 U/L (ref 0–44)
AST: 17 U/L (ref 15–41)
Albumin: 4.1 g/dL (ref 3.5–5.0)
Alkaline Phosphatase: 35 U/L — ABNORMAL LOW (ref 38–126)
Anion gap: 11 (ref 5–15)
BUN: 11 mg/dL (ref 6–20)
CO2: 27 mmol/L (ref 22–32)
Calcium: 9.4 mg/dL (ref 8.9–10.3)
Chloride: 103 mmol/L (ref 98–111)
Creatinine, Ser: 0.77 mg/dL (ref 0.44–1.00)
GFR, Estimated: >60 mL/min (ref >60)
Glucose, Bld: 108 mg/dL — ABNORMAL HIGH (ref 70–99)
Potassium: 2.8 mmol/L — ABNORMAL LOW (ref 3.5–5.1)
Sodium: 141 mmol/L (ref 135–145)
Total Bilirubin: 1.1 mg/dL (ref 0.3–1.2)
Total Protein: 7.5 g/dL (ref 6.5–8.1)

## 2021-05-23 LAB — LIPID PANEL
Cholesterol: 219 mg/dL — ABNORMAL HIGH (ref 0–200)
HDL: 64 mg/dL (ref >40)
LDL Cholesterol: 144 mg/dL — ABNORMAL HIGH (ref 0–99)
Total CHOL/HDL Ratio: 3.4 RATIO
Triglycerides: 53 mg/dL (ref <150)
VLDL: 11 mg/dL (ref 0–40)

## 2021-05-23 LAB — RAPID URINE DRUG SCREEN, HOSP PERFORMED
Amphetamines: NOT DETECTED
Barbiturates: NOT DETECTED
Benzodiazepines: NOT DETECTED
Cocaine: NOT DETECTED
Opiates: NOT DETECTED
Tetrahydrocannabinol: POSITIVE — AB

## 2021-05-23 MED ORDER — HYDROXYZINE HCL 10 MG PO TABS
10.0000 mg | ORAL_TABLET | Freq: Two times a day (BID) | ORAL | Status: DC
  Administered 2021-05-23: 10 mg via ORAL
  Filled 2021-05-23 (×4): qty 1

## 2021-05-23 MED ORDER — TRAZODONE HCL 50 MG PO TABS: 50.0000 mg | ORAL_TABLET | Freq: Every evening | ORAL | Status: DC | PRN

## 2021-05-23 MED ORDER — ZIPRASIDONE MESYLATE 20 MG IM SOLR: 20.0000 mg | INTRAMUSCULAR | Status: DC | PRN

## 2021-05-23 MED ORDER — MAGNESIUM HYDROXIDE 400 MG/5ML PO SUSP: 30.0000 mL | Freq: Every day | ORAL | Status: DC | PRN

## 2021-05-23 MED ORDER — LORAZEPAM 1 MG PO TABS: 1.0000 mg | ORAL_TABLET | ORAL | Status: DC | PRN

## 2021-05-23 MED ORDER — ACETAMINOPHEN 325 MG PO TABS: 650.0000 mg | ORAL_TABLET | Freq: Four times a day (QID) | ORAL | Status: DC | PRN

## 2021-05-23 MED ORDER — HYDROXYZINE HCL 25 MG PO TABS
25.0000 mg | ORAL_TABLET | Freq: Three times a day (TID) | ORAL | Status: DC | PRN
  Filled 2021-05-23: qty 10

## 2021-05-23 MED ORDER — ALUM & MAG HYDROXIDE-SIMETH 200-200-20 MG/5ML PO SUSP: 30.0000 mL | ORAL | Status: DC | PRN

## 2021-05-23 MED ORDER — OLANZAPINE 5 MG PO TBDP: 5.0000 mg | ORAL_TABLET | Freq: Three times a day (TID) | ORAL | Status: DC | PRN

## 2021-05-23 MED ORDER — CEPHALEXIN 500 MG PO CAPS
500.0000 mg | ORAL_CAPSULE | Freq: Two times a day (BID) | ORAL | Status: DC
  Administered 2021-05-23 – 2021-05-28 (×11): 500 mg via ORAL
  Filled 2021-05-23: qty 2
  Filled 2021-05-23 (×12): qty 1

## 2021-05-23 MED ORDER — OLANZAPINE 10 MG PO TABS
10.0000 mg | ORAL_TABLET | Freq: Every day | ORAL | Status: DC
  Administered 2021-05-23 – 2021-05-27 (×5): 10 mg via ORAL
  Filled 2021-05-23 (×6): qty 1
  Filled 2021-05-23: qty 7

## 2021-05-23 MED ORDER — OLANZAPINE 2.5 MG PO TABS
2.5000 mg | ORAL_TABLET | Freq: Once | ORAL | Status: AC
  Administered 2021-05-23: 2.5 mg via ORAL
  Filled 2021-05-23 (×2): qty 1

## 2021-05-23 MED ORDER — OLANZAPINE 10 MG PO TBDP: 10.0000 mg | ORAL_TABLET | Freq: Three times a day (TID) | ORAL | Status: DC | PRN

## 2021-05-23 NOTE — [facility] Group Notes (Signed)
Relaxation-attended  

## 2021-05-23 NOTE — ED Notes (Signed)
[facility] called for transport to [facility]. 

## 2021-05-23 NOTE — Progress Notes (Signed)
This patient has been accepted to [facility] 401-1. Please fax vol consent prior to transport.   Accepting provider- Liborio Nixon Attending provider- Mason Jim Fax [phone] Report to [phone]

## 2021-05-23 NOTE — [facility] Group Notes (Signed)
Patient attended and contributed

## 2021-05-23 NOTE — Progress Notes (Signed)
Recreation Therapy Notes  Date:  9.2.22 Time: 0930 Location: 300 Hall Dayroom  Group Topic: Stress Management  Goal Area(s) Addresses:  Patient will identify positive stress management techniques. Patient will identify benefits of using stress management post d/c.  Behavioral Response: Attentive  Intervention: Stress Management  Activity :  LRT played a meditation that focused on setting healthy boundaries even if that upsets the people around you.  Education:  Stress Management, Discharge Planning.   Education Outcome: Acknowledges Education  Clinical Observations/Feedback: Pt attended and participated.  Pt had no questions or concerns.    Caroll Rancher, LRT/CTRS      Caroll Rancher A 05/23/2021 11:27 AM

## 2021-05-23 NOTE — Progress Notes (Signed)
Pt is a 35 y.o. female who was admitted at [facility] voluntarily for significant anxiety symptoms, insomnia, and decreased appetite.  Pt denies SI/HI, but endorses visual hallucinations.  Pt says she sees vivid pictures of people in a car crash and sees herself dying in the car crash.  Pt said that she is scared to drive her car because she feels she is going to die in a MVA.  Pt says triggers are work, taking care of child who is 5 and has started kindergarten and just "all of life." Pt says she has strong support system with boyfriend, and mom other family members. Pt admits to vaping CBD and drinks wine 3 x a week. Pt said she has not slept in 4 days and her goals are to sleep and get referrals for therapy.

## 2021-05-23 NOTE — BH IP Treatment Plan (Signed)
Interdisciplinary Treatment and Diagnostic Plan Update  05/23/2021 Time of Session: 9:25am Kerry Dixon MRN: [id number]  Principal Diagnosis: <principal problem not specified>  Secondary Diagnoses: Active Problems:   MDD (major depressive disorder), recurrent, severe, with psychosis (HCC)   Current Medications:  Current Facility-Administered Medications  Medication Dose Route Frequency Provider Last Rate Last Admin   acetaminophen (TYLENOL) tablet 650 mg  650 mg Oral Q6H PRN Jaclyn Shaggy, PA-C       alum & mag hydroxide-simeth (MAALOX/MYLANTA) 200-200-20 MG/5ML suspension 30 mL  30 mL Oral Q4H PRN Melbourne Abts W, PA-C       cephALEXin (KEFLEX) capsule 500 mg  500 mg Oral BID Melbourne Abts W, PA-C       hydrOXYzine (ATARAX/VISTARIL) tablet 10 mg  10 mg Oral BID Ladona Ridgel, Cody W, PA-C       magnesium hydroxide (MILK OF MAGNESIA) suspension 30 mL  30 mL Oral Daily PRN Melbourne Abts W, PA-C       traZODone (DESYREL) tablet 50 mg  50 mg Oral QHS PRN Jaclyn Shaggy, PA-C       PTA Medications: Medications Prior to Admission  Medication Sig Dispense Refill Last Dose   ASHWAGANDHA PO Take 1 capsule by mouth daily.      buPROPion (WELLBUTRIN) 75 MG tablet Take 75 mg by mouth 2 (two) times daily. (Patient not taking: No sig reported)      hydrOXYzine (ATARAX/VISTARIL) 10 MG tablet Take 10 mg by mouth 2 (two) times daily.       Patient Stressors: Job stress, being a mom   Patient Strengths: motivation to live and optimal well-being for son.   Treatment Modalities: Medication Management, Group therapy, Case management,  1 to 1 session with clinician, Psychoeducation, Recreational therapy.   Physician Treatment Plan for Primary Diagnosis: <principal problem not specified> Long Term Goal(s):     Short Term Goals:    Medication Management: Evaluate patient's response, side effects, and tolerance of medication regimen.  Therapeutic Interventions: 1 to 1 sessions, Unit Group sessions and  Medication administration.  Evaluation of Outcomes: Progressing  Physician Treatment Plan for Secondary Diagnosis: Active Problems:   MDD (major depressive disorder), recurrent, severe, with psychosis (HCC)  Long Term Goal(s):     Short Term Goals:       Medication Management: Evaluate patient's response, side effects, and tolerance of medication regimen.  Therapeutic Interventions: 1 to 1 sessions, Unit Group sessions and Medication administration.  Evaluation of Outcomes: Progressing   RN Treatment Plan for Primary Diagnosis: <principal problem not specified> Long Term Goal(s): Knowledge of disease and therapeutic regimen to maintain health will improve  Short Term Goals: Ability to verbalize frustration and anger appropriately will improve, Ability to demonstrate self-control, Ability to verbalize feelings will improve, and Ability to identify and develop effective coping behaviors will improve  Medication Management: RN will administer medications as ordered by provider, will assess and evaluate patient's response and provide education to patient for prescribed medication. RN will report any adverse and/or side effects to prescribing provider.  Therapeutic Interventions: 1 on 1 counseling sessions, Psychoeducation, Medication administration, Evaluate responses to treatment, Monitor vital signs and CBGs as ordered, Perform/monitor CIWA, COWS, AIMS and Fall Risk screenings as ordered, Perform wound care treatments as ordered.  Evaluation of Outcomes: Progressing   LCSW Treatment Plan for Primary Diagnosis: <principal problem not specified> Long Term Goal(s): Safe transition to appropriate next level of care at discharge, Engage patient in therapeutic group addressing interpersonal concerns.  Short  Term Goals: Engage patient in aftercare planning with referrals and resources, Increase ability to appropriately verbalize feelings, Increase emotional regulation, Facilitate acceptance  of mental health diagnosis and concerns, Identify triggers associated with mental health/substance abuse issues, and Increase skills for wellness and recovery  Therapeutic Interventions: Assess for all discharge needs, 1 to 1 time with Social worker, Explore available resources and support systems, Assess for adequacy in community support network, Educate family and significant other(s) on suicide prevention, Complete Psychosocial Assessment, Interpersonal group therapy.  Evaluation of Outcomes: Progressing   Progress in Treatment: Attending groups: Yes. Participating in groups: Yes. Taking medication as prescribed: Yes. Toleration medication: Yes. Family/Significant other contact made: No, will contact:  Patient has not participated in assessment or signed consents at this time Patient understands diagnosis: No. Recently admitted Discussing patient identified problems/goals with staff: Yes. Medical problems stabilized or resolved: Yes. Denies suicidal/homicidal ideation: Yes. Issues/concerns per patient self-inventory: No.  New problem(s) identified: No, Describe:  no new problems identified  New Short Term/Long Term Goal(s):  medication stabilization, elimination of SI thoughts, development of comprehensive mental wellness plan.    Patient Goals:  Patient states, " I would like to develop more insight and have some more education around my mental health diagnosis"  Discharge Plan or Barriers: Patient recently admitted. CSW will continue to follow and assess for appropriate referrals and possible discharge planning.    Reason for Continuation of Hospitalization: Anxiety Depression Medication stabilization  Estimated Length of Stay: 3-5 days   Scribe for Treatment Team: Beatris Si, LCSW 05/23/2021 10:09 AM
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
## 2021-05-23 NOTE — Progress Notes (Signed)
Patient's sister called and wished to speak to the chaplain regarding some POA paperwork that the chaplain at [facility] was assisting her sister with.  Sister was under the impression that it was mental health power of attorney paperwork. Patient then found chaplain and showed her Health care proxy POA paperwork.  She had most of the paperwork completed.  Chaplain explained that patient could get it notarized at any bank.  Patient declined any other support.  Chaplain Dyanne Carrel, Bcc PAger, [phone] 5:13 PM

## 2021-05-23 NOTE — ED Notes (Signed)
Pt ambulated off unit with steady gait to [facility] vehicle for transportation to [facility]
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
## 2021-05-23 NOTE — Tx Team (Signed)
Initial Treatment Plan 05/23/2021 1:03 PM Marcea Rojek [id number]    PATIENT STRESSORS: Marital or family conflict     PATIENT STRENGTHS: Ability for insight  Active sense of humor  Average or above average intelligence  Communication skills  Supportive family/friends  Work skills    PATIENT IDENTIFIED PROBLEMS: Anxiety  Hallucinations  Insomnia                 DISCHARGE CRITERIA:  Improved stabilization in mood, thinking, and/or behavior  PRELIMINARY DISCHARGE PLAN: Outpatient therapy  PATIENT/FAMILY INVOLVEMENT: This treatment plan has been presented to and reviewed with the patient, Brinlynn Gorton.  The patient has been given the opportunity to ask questions and make suggestions.  Garnette Scheuermann, RN 05/23/2021, 1:03 PM

## 2021-05-23 NOTE — [facility] Group Notes (Signed)
Type of Therapy and Topic:  Group Therapy:  Self-Esteem   Participation Level:  Did not attend   Description of Group: This group addressed positive self-esteem. Patients were given a worksheet with a blank shield. Patients were asked what a shield is and when it is used. Patients were asked to list, draw, or write protective factors in the their lives on their shields. Patients discussed the words, ideas and drawings that they put on their shield. Patients were encouraged to have a daily reflection of positive characteristics/ protective factors.  Therapeutic Goals Patient will verbalize two of their positive qualities Patient will demonstrate insight but naming social supports in their lives Patient will verbalize their feelings when voicing positive self affirmations and when voicing positive affirmations of others Patients will discuss the potential positive impact on their wellness/recovery of focusing on positive traits of self and others.  Summary of Patient Progress:    Did not attend  

## 2021-05-23 NOTE — ED Notes (Signed)
Pt belongings inventoried with pt; advised pt belongings will be sent with [facility]; pt verbalized understanding; consent for transport signed; consent for treatment signed; consent for transfer signed

## 2021-05-24 DIAGNOSIS — F333 Major depressive disorder, recurrent, severe with psychotic symptoms: Secondary | ICD-10-CM | POA: Diagnosis not present

## 2021-05-24 DIAGNOSIS — F19232 Other psychoactive substance dependence with withdrawal with perceptual disturbance: Secondary | ICD-10-CM | POA: Insufficient documentation

## 2021-05-24 MED ORDER — POTASSIUM CHLORIDE CRYS ER 20 MEQ PO TBCR
20.0000 meq | EXTENDED_RELEASE_TABLET | Freq: Two times a day (BID) | ORAL | Status: DC
  Administered 2021-05-24 – 2021-05-26 (×4): 20 meq via ORAL
  Filled 2021-05-24 (×11): qty 1

## 2021-05-24 NOTE — Plan of Care (Signed)
  Problem: Education: Goal: Knowledge of [facility] Education information/materials will improve Outcome: Progressing Goal: Emotional status will improve Outcome: Progressing Goal: Mental status will improve Outcome: Progressing Goal: Verbalization of understanding the information provided will improve Outcome: Progressing   

## 2021-05-24 NOTE — Progress Notes (Signed)
Progress note    05/24/21 0800  Psych Admission Type (Psych Patients Only)  Admission Status Voluntary  Psychosocial Assessment  Patient Complaints Anxiety  Eye Contact Fair  Facial Expression Animated;Anxious;Pensive  Affect Anxious  Speech Logical/coherent  Interaction Assertive  Motor Activity Slow  Appearance/Hygiene Unremarkable  Behavior Characteristics Cooperative;Appropriate to situation;Anxious  Mood Anxious;Pleasant  Thought Process  Coherency WDL  Content WDL  Delusions None reported or observed  Perception WDL  Hallucination None reported or observed  Judgment Poor  Confusion None  Danger to Self  Current suicidal ideation? Denies  Danger to Others  Danger to Others None reported or observed

## 2021-05-24 NOTE — Group Note (Signed)
Clinical Social Work Note  No group could be held today due to a COVID-19 outbreak on the unit.  An educational packet about anger management was provided to the patient to work on individually.  Neidy Guerrieri Grossman-Orr, LCSW 05/24/2021, 8:51 AM    

## 2021-05-24 NOTE — [facility] Counselor (Signed)
Adult Comprehensive Assessment  Patient ID: Meghann Landing, female   DOB: May 10, 1986, 35 y.o.   MRN: [id number]  Information Source: Information source: Patient  Current Stressors:  Patient states their primary concerns and needs for treatment are:: "Haven't slept" Patient states their goals for this hospitilization and ongoing recovery are:: ""Getting out of here; learning coping skills" Educational / Learning stressors: Denies stressor Employment / Job issues: Yes, at times; can manage it Family Relationships: Denies Metallurgist / Lack of resources (include bankruptcy): Denies stressor Housing / Lack of housing: "Not really, need to have better work/life balance Physical health (include injuries & life threatening diseases): Denies stressor Social relationships: Social anxiety Substance abuse: Denies stressor Bereavement / Loss: Denies stressor  Living/Environment/Situation:  Living Arrangements: Spouse/significant other, Children Living conditions (as described by patient or guardian): "Good" Who else lives in the home?: Boyfriend, 5y.o. son How long has patient lived in current situation?: 9 years What is atmosphere in current home: Comfortable, Paramedic, Supportive  Family History:  Marital status: Long term relationship Long term relationship, how long?: 15 years What types of issues is patient dealing with in the relationship?: None Additional relationship information: n/a Are you sexually active?: Yes Has your sexual activity been affected by drugs, alcohol, medication, or emotional stress?: Denies Does patient have children?: Yes How many children?: 1 How is patient's relationship with their children?: Patient has a great relationship with her 54 y.o. son  Childhood History:  By whom was/is the patient raised?: Both parents Additional childhood history information: "Pretty good; got what we wanted" Description of patient's relationship with caregiver when they were  a child: Good Patient's description of current relationship with people who raised him/her: distant, better with mother than with father How were you disciplined when you got in trouble as a child/adolescent?: Whoopings Does patient have siblings?: Yes Number of Siblings: 1 Description of patient's current relationship with siblings: Good Did patient suffer any verbal/emotional/physical/sexual abuse as a child?: No Did patient suffer from severe childhood neglect?: No Has patient ever been sexually abused/assaulted/raped as an adolescent or adult?: No Was the patient ever a victim of a crime or a disaster?: No Witnessed domestic violence?: No Has patient been affected by domestic violence as an adult?: No  Education:  Highest grade of school patient has completed: Scientist, research (physical sciences) Currently a student?: No Learning disability?: No  Employment/Work Situation:   Employment Situation: Employed 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?: No  Financial Resources:   Financial resources: Income from employment, Income from spouse Does patient have a representative payee or guardian?: No  Alcohol/Substance Abuse:   What has been your use of drugs/alcohol within the last 12 months?: Occassional alcohol and Delta 8 use. Denies all other use If attempted suicide, did drugs/alcohol play a role in this?: No Alcohol/Substance Abuse Treatment Hx: Denies past history Has alcohol/substance abuse ever caused legal problems?: No  Social Support System:   Patient's Community Support System: Good Describe Community Support  System: boyfriend, mother, sister, boyfriends family Type of faith/religion: Ephriam Knuckles How does patient's faith help to cope with current illness?: Read bible, pray  Leisure/Recreation:   Do You Have Hobbies?: Yes Leisure and Hobbies: Being outside  Strengths/Needs:   What is the patient's perception of their strengths?: "Really good teacher, awesome mother" Patient states they can use these personal strengths during their treatment to contribute to their recovery: Unsure Patient states these barriers may affect/interfere with their treatment: None Patient states these barriers may affect their return to the community: None Other important information patient would like considered in planning for their treatment: None  Discharge Plan:   Currently receiving community mental health services: No Patient states concerns and preferences for aftercare planning are: Pt is interested in being set up with therapy and medication management Patient states they will know when they are safe and ready for discharge when: Yes Does patient have access to transportation?: Yes Does patient have financial barriers related to discharge medications?: Yes Patient description of barriers related to discharge medications: no insurance Will patient be returning to same living situation after discharge?: Yes  Summary/Recommendations:   Summary and Recommendations (to be completed by the evaluator): Edilia Ghuman was admitted due to not sleeping for 4 days and experiencing chest pains. Pt has a hx of MDD and GAD. Recent stressors include work life balance. Pt currently sees no outpatient providers. While here, Berklee Battey can benefit from crisis stabilization, medication management, therapeutic milieu, and referrals for services.  Tilla Wilborn A Amel Kitch. 05/24/2021
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
## 2021-05-24 NOTE — Progress Notes (Signed)
   05/23/21 2125  Psych Admission Type (Psych Patients Only)  Admission Status Voluntary  Psychosocial Assessment  Patient Complaints Worrying;Restlessness;Depression  Eye Contact Brief  Facial Expression Anxious;Sad  Affect Depressed;Sad;Anxious  Speech Logical/coherent  Interaction Assertive  Motor Activity Restless  Appearance/Hygiene Unremarkable  Behavior Characteristics Appropriate to situation;Cooperative  Mood Depressed;Preoccupied;Pleasant  Thought Process  Coherency WDL  Content WDL  Delusions WDL  Perception Hallucinations  Hallucination Visual  Judgment Impaired  Confusion WDL  Danger to Self  Current suicidal ideation? Denies  Danger to Others  Danger to Others None reported or observed

## 2021-05-25 DIAGNOSIS — F333 Major depressive disorder, recurrent, severe with psychotic symptoms: Secondary | ICD-10-CM | POA: Diagnosis not present

## 2021-05-25 LAB — BASIC METABOLIC PANEL
Anion gap: 7 (ref 5–15)
BUN: 16 mg/dL (ref 6–20)
CO2: 27 mmol/L (ref 22–32)
Calcium: 9.3 mg/dL (ref 8.9–10.3)
Chloride: 110 mmol/L (ref 98–111)
Creatinine, Ser: 0.65 mg/dL (ref 0.44–1.00)
GFR, Estimated: >60 mL/min (ref >60)
Glucose, Bld: 89 mg/dL (ref 70–99)
Potassium: 3.5 mmol/L (ref 3.5–5.1)
Sodium: 144 mmol/L (ref 135–145)
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
## 2021-05-25 NOTE — Progress Notes (Signed)
EKG completed and results reported to MD [facility], and placed on pt's chart. It reads: "Normal Sinus Rhythm, Normal ECG."

## 2021-05-25 NOTE — Progress Notes (Signed)
D: Patient appears as guarded with brief eye contact. Patient was short with her responses during assessment. Patient denies SI/HI and AVH at this time. Patient denies feelings of anxiety at this time. A: Administered medications as per MAR orders. Encouraged patient to come to staff with any concerns.  R: Patient is calm and cooperative. Patient is medication compliant. Patient remains safe on the unit at this time.   05/25/21 2121  Psych Admission Type (Psych Patients Only)  Admission Status Voluntary  Psychosocial Assessment  Patient Complaints None  Eye Contact Brief  Facial Expression Flat  Affect Depressed  Speech Logical/coherent  Interaction Cautious  Motor Activity Slow  Appearance/Hygiene Unremarkable  Behavior Characteristics Cooperative;Calm  Mood Pleasant  Thought Process  Coherency WDL  Content WDL  Delusions None reported or observed  Perception WDL  Hallucination None reported or observed  Judgment Limited  Confusion None  Danger to Self  Current suicidal ideation? Denies  Danger to Others  Danger to Others None reported or observed

## 2021-05-25 NOTE — Group Note (Signed)
Clinical Social Work Note  Group was not held in person due to COVID-19 outbreak and isolation requirements on the unit. CSW provided handouts on building healthy supports to work on individually in rooms.  Damyn Weitzel Grossman-Orr, LCSW 05/25/2021, 12:19 PM    

## 2021-05-25 NOTE — [facility] Group Notes (Signed)
The focus of this group is to help patients establish daily goals to achieve during treatment and discuss how the patient can incorporate goal setting into their daily lives to aide in recovery.  Pt stated that her goal is "Remain positive"

## 2021-05-25 NOTE — Progress Notes (Signed)
Pt has been alert and oriented to person, place, time and situation, denies SI/HI/AVH. Pt is calm, cooperative, pleasant. Pt smiles on contact, voices no complaints, is social with peers. Will continue to monitor pt per Q15 minute face checks and monitor for safety and progress.

## 2021-05-25 NOTE — [facility] Group Notes (Signed)
Adult Psychoeducational Group Note  Date:  05/25/2021 Time:  4:35 PM  Group Topic/Focus:  Self Care:   The focus of this group is to help patients understand the importance of self-care in order to improve or restore emotional, physical, spiritual, interpersonal, and financial health.  Participation Level:  Active  Participation Quality:  Appropriate  Affect:  Appropriate  Cognitive:  Appropriate  Insight: Appropriate  Engagement in Group:  Engaged  Modes of Intervention:  Education  Additional Comments:    Donell Beers 05/25/2021, 4:35 PM

## 2021-05-26 DIAGNOSIS — F333 Major depressive disorder, recurrent, severe with psychotic symptoms: Secondary | ICD-10-CM | POA: Diagnosis not present

## 2021-05-26 LAB — BASIC METABOLIC PANEL
Anion gap: 7 (ref 5–15)
BUN: 10 mg/dL (ref 6–20)
CO2: 26 mmol/L (ref 22–32)
Calcium: 9.4 mg/dL (ref 8.9–10.3)
Chloride: 106 mmol/L (ref 98–111)
Creatinine, Ser: 0.72 mg/dL (ref 0.44–1.00)
GFR, Estimated: >60 mL/min (ref >60)
Glucose, Bld: 159 mg/dL — ABNORMAL HIGH (ref 70–99)
Potassium: 4.3 mmol/L (ref 3.5–5.1)
Sodium: 139 mmol/L (ref 135–145)

## 2021-05-26 LAB — RESP PANEL BY RT-PCR (FLU A&B, COVID) ARPGX2
Influenza A by PCR: NEGATIVE
Influenza B by PCR: NEGATIVE
SARS Coronavirus 2 by RT PCR: NEGATIVE

## 2021-05-26 NOTE — Progress Notes (Signed)
Kerry Dixon voiced no complaints this evening.  She denied SI/HI or AVH.  She took her hs medication without difficulty.  She is currently resting with her eyes closed and appears to be asleep.  Q 15 minute checks maintained for safety.     05/26/21 2133  Psych Admission Type (Psych Patients Only)  Admission Status Voluntary  Psychosocial Assessment  Patient Complaints None  Eye Contact Brief  Facial Expression Flat  Affect Depressed  Speech Logical/coherent  Interaction Cautious  Motor Activity Slow  Appearance/Hygiene Unremarkable  Behavior Characteristics Cooperative;Appropriate to situation  Mood Pleasant  Thought Process  Coherency WDL  Content WDL  Delusions None reported or observed  Perception WDL  Hallucination None reported or observed  Judgment Limited  Confusion None  Danger to Self  Current suicidal ideation? Denies  Danger to Others  Danger to Others None reported or observed

## 2021-05-26 NOTE — Progress Notes (Signed)
Adult Psychoeducational Group Note  Date:  05/26/2021 Time:  12:29 AM  Group Topic/Focus:  Wrap-Up Group:   The focus of this group is to help patients review their daily goal of treatment and discuss progress on daily workbooks.  Participation Level:  Active  Participation Quality:  Appropriate  Affect:  Appropriate  Cognitive:  Appropriate  Insight: Appropriate  Engagement in Group:  Developing/Improving  Modes of Intervention:  Discussion  Additional Comments:  Pt stated her goal for today was to focus on her treatment plan. Pt stated she accomplished her goal today. Pt stated she talked with her doctor but did not get a chance to talk with her social worker about her care today. Pt rated her overall day a 6 out of 10. Pt stated she was able to contact her sister, mother, and boyfriend today which improved her overall day. Pt stated she felt better about herself tonight. Pt stated she was able to attend all groups held today. Pt stated staff brought back all meals today because of Covid outbreak. Pt stated she took all medications provided today. Pt stated her appetite was pretty good today. Pt rated her sleep last night was pretty good. Pt stated the goal tonight was to get some rest. Pt stated she had no physical pain today. Pt deny visual hallucinations and auditory issues tonight. Pt denies thoughts of harming herself or others. Pt stated she would alert staff if anything changed.  Kerry Dixon 05/26/2021, 12:29 AM

## 2021-05-26 NOTE — [facility] Group Notes (Signed)
Topic:   Due to the acuity, complex discharge plans, and Covid-19 precautions, group was not held. Patient was provided therapeutic worksheets and asked to meet with CSW as needed. 

## 2021-05-26 NOTE — Progress Notes (Signed)
[facility] MD Progress Note  05/26/2021 4:39 PM Kerry Dixon  MRN:  [id number]  Reason for admission:  Kerry Dixon 35 year old female with history of prior treatment by her PCP for anxiety who initially presented to [facility] as a walk-in with her mother with complaints of chest pain and not sleeping for 4 days.  Patient reported symptoms of fatigue, night terrors and visions of people per chart notes.  Patient subsequently endorsed experiencing psychic premonitions about bad future events, dj vu experiences, anxiety, panic, brief paranoia and misinterpretation of events in the ED where she displayed 1 incident of aggressive behavior towards staff.  She reported using delta 8 intermittently over the past year and UDS was positive for [facility] in the ED.  Objective: Medical record reviewed.  Patient's case discussed in detail with nursing staff and members of the treatment team.  I met with and evaluated the patient on the unit today for follow-up.  Patient appears improved from when I last saw her on Friday but is only superficially engaged in conversation with me and may be minimizing her symptoms.  She maintains good eye contact and stable affect.  Thought processes are coherent and goal-directed.  She does not appear to attend or respond to internal stimuli.  Patient states that she feels much better since starting Zyprexa.  She denies dj vu experiences, psychic experiences, premonitions about the future including premonitions of herself or loved ones dying in accidents, thought broadcasting, referential thinking, special messages, telepathic experiences, other first rank symptoms, PI, AH or VH.  Patient denies depressed mood, anhedonia, passive wish for death, SI, AI or HI.  Patient denies significant worry, anxiety or panic attacks.  She states that she has been eating and sleeping well.  We discussed the possibility of starting an antidepressant medication but patient would like to remain on Zyprexa at this time and  does not want to start additional medications.  She denies depressed mood or anhedonia today.  Patient denies medication side effects or any physical symptoms.  We again discussed the importance of refraining from use of delta 8, THC or other substances and the risks associated with substance use.  Patient stated understanding of information discussed and stated her plan to refrain from use of substances.  Staff document 6.25 hours of sleep last night.  Vital signs performed today include BP of 128/78 and pulse 75 reclining; BP 130/95 and pulse 91 sitting; and BP 136/98 and pulse 105 standing.  Repeat Influenza A, influenza B and coronavirus testing were negative today.  Most recent CHEM panel obtained on 05/25/2021 was WNL with potassium of 3.5 (up from 2.5 on evening of 05/23/2021).  Hemoglobin A1c has been performed but results are still pending.  Patient has been taking scheduled medications as prescribed.  She has not taken any PRN medications since admission.  Principal Problem: MDD (major depressive disorder), recurrent, severe, with psychosis ([place]) Diagnosis: Principal Problem:   MDD (major depressive disorder), recurrent, severe, with psychosis ([place]) Active Problems:   GAD (generalized anxiety disorder)   Panic attacks   Substance-induced psychotic disorder with onset during withdrawal with delusions ([place])  Total Time spent with patient:  25 minutes  Past Psychiatric History: See admission H&P  Past Medical History: History reviewed. No pertinent past medical history. History reviewed. No pertinent surgical history. Family History: History reviewed. No pertinent family history. Family Psychiatric  History: See admission H&P Social History:  Social History   Substance and Sexual Activity  Alcohol Use Yes   Alcohol/week:  3.0 standard drinks   Types: 3 Glasses of wine per week     Social History   Substance and Sexual Activity  Drug Use Not on file    Social History    Socioeconomic History   Marital status: Single    Spouse name: Not on file   Number of children: Not on file   Years of education: Not on file   Highest education level: Not on file  Occupational History   Not on file  Tobacco Use   Smoking status: Never   Smokeless tobacco: Not on file  Vaping Use   Vaping Use: Every day  Substance and Sexual Activity   Alcohol use: Yes    Alcohol/week: 3.0 standard drinks    Types: 3 Glasses of wine per week   Drug use: Not on file   Sexual activity: Not on file  Other Topics Concern   Not on file  Social History Narrative   Not on file   Social Determinants of Health   Financial Resource Strain: Not on file  Food Insecurity: Not on file  Transportation Needs: Not on file  Physical Activity: Not on file  Stress: Not on file  Social Connections: Not on file   Additional Social History:                         Sleep: Good  Appetite:  Good  Current Medications: Current Facility-Administered Medications  Medication Dose Route Frequency Provider Last Rate Last Admin   acetaminophen (TYLENOL) tablet 650 mg  650 mg Oral Q6H PRN Prescilla Sours, PA-C       alum & mag hydroxide-simeth (MAALOX/MYLANTA) 200-200-20 MG/5ML suspension 30 mL  30 mL Oral Q4H PRN Margorie John W, PA-C       cephALEXin (KEFLEX) capsule 500 mg  500 mg Oral BID Lovena Le, Cody W, PA-C   500 mg at 05/26/21 1633   hydrOXYzine (ATARAX/VISTARIL) tablet 25 mg  25 mg Oral TID PRN Arthor Captain, MD       OLANZapine zydis (ZYPREXA) disintegrating tablet 10 mg  10 mg Oral Q8H PRN Arthor Captain, MD       And   LORazepam (ATIVAN) tablet 1 mg  1 mg Oral PRN Arthor Captain, MD       And   ziprasidone (GEODON) injection 20 mg  20 mg Intramuscular PRN Arthor Captain, MD       magnesium hydroxide (MILK OF MAGNESIA) suspension 30 mL  30 mL Oral Daily PRN Margorie John W, PA-C       OLANZapine (ZYPREXA) tablet 10 mg  10 mg Oral QHS Arthor Captain, MD   10 mg at  05/25/21 2121   traZODone (DESYREL) tablet 50 mg  50 mg Oral QHS PRN Arthor Captain, MD        Lab Results:  Results for orders placed or performed during the hospital encounter of 05/23/21 (from the past 48 hour(s))  Basic metabolic panel     Status: None   Collection Time: 05/25/21  6:24 AM  Result Value Ref Range   Sodium 144 135 - 145 mmol/L   Potassium 3.5 3.5 - 5.1 mmol/L    Comment: DELTA CHECK NOTED   Chloride 110 98 - 111 mmol/L   CO2 27 22 - 32 mmol/L   Glucose, Bld 89 70 - 99 mg/dL    Comment: Glucose reference range applies only to samples taken after fasting for  at least 8 hours.   BUN 16 6 - 20 mg/dL   Creatinine, Ser 0.65 0.44 - 1.00 mg/dL   Calcium 9.3 8.9 - 10.3 mg/dL   GFR, Estimated >60 >60 mL/min    Comment: (NOTE) Calculated using the CKD-EPI Creatinine Equation (2021)    Anion gap 7 5 - 15    Comment: Performed at [facility], [place] [address] [place], [place] [address]  Resp Panel by RT-PCR (Flu A&B, Covid) Nasopharyngeal Swab     Status: None   Collection Time: 05/26/21  4:19 AM   Specimen: Nasopharyngeal Swab; Nasopharyngeal(NP) swabs in vial transport medium  Result Value Ref Range   SARS Coronavirus 2 by RT PCR NEGATIVE NEGATIVE    Comment: (NOTE) SARS-CoV-2 target nucleic acids are NOT DETECTED.  The SARS-CoV-2 RNA is generally detectable in upper respiratory specimens during the acute phase of infection. The lowest concentration of SARS-CoV-2 viral copies this assay can detect is 138 copies/mL. A negative result does not preclude SARS-Cov-2 infection and should not be used as the sole basis for treatment or other patient management decisions. A negative result may occur with  improper specimen collection/handling, submission of specimen other than nasopharyngeal swab, presence of viral mutation(s) within the areas targeted by this assay, and inadequate number of viral copies(<138 copies/mL). A negative result must be combined  with clinical observations, patient history, and epidemiological information. The expected result is Negative.  Fact Sheet for Patients:  EntrepreneurPulse.com.au  Fact Sheet for Healthcare Providers:  IncredibleEmployment.be  This test is no t yet approved or cleared by the [place] FDA and  has been authorized for detection and/or diagnosis of SARS-CoV-2 by FDA under an Emergency Use Authorization (EUA). This EUA will remain  in effect (meaning this test can be used) for the duration of the COVID-19 declaration under Section 564(b)(1) of the Act, 21 U.S.C.section 360bbb-3(b)(1), unless the authorization is terminated  or revoked sooner.       Influenza A by PCR NEGATIVE NEGATIVE   Influenza B by PCR NEGATIVE NEGATIVE    Comment: (NOTE) The Xpert Xpress SARS-CoV-2/FLU/RSV plus assay is intended as an aid in the diagnosis of influenza from Nasopharyngeal swab specimens and should not be used as a sole basis for treatment. Nasal washings and aspirates are unacceptable for Xpert Xpress SARS-CoV-2/FLU/RSV testing.  Fact Sheet for Patients: EntrepreneurPulse.com.au  Fact Sheet for Healthcare Providers: IncredibleEmployment.be  This test is not yet approved or cleared by the [place] FDA and has been authorized for detection and/or diagnosis of SARS-CoV-2 by FDA under an Emergency Use Authorization (EUA). This EUA will remain in effect (meaning this test can be used) for the duration of the COVID-19 declaration under Section 564(b)(1) of the Act, 21 U.S.C. section 360bbb-3(b)(1), unless the authorization is terminated or revoked.  Performed at [facility], [place] [address] [place], [place] [address]     Blood Alcohol level:  Lab Results  Component Value Date   ETH <10 01/00/7121    Metabolic Disorder Labs: No results found for: HGBA1C, MPG No results found for:  PROLACTIN Lab Results  Component Value Date   CHOL 219 (H) 05/23/2021   TRIG 53 05/23/2021   HDL 64 05/23/2021   CHOLHDL 3.4 05/23/2021   VLDL 11 05/23/2021   LDLCALC 144 (H) 05/23/2021    Physical Findings: AIMS: Facial and Oral Movements Muscles of Facial Expression: None, normal Lips and Perioral Area: None, normal Jaw: None, normal Tongue: None, normal,Extremity Movements Upper (  arms, wrists, hands, fingers): None, normal Lower (legs, knees, ankles, toes): None, normal, Trunk Movements Neck, shoulders, hips: None, normal, Overall Severity Severity of abnormal movements (highest score from questions above): None, normal Incapacitation due to abnormal movements: None, normal Patient's awareness of abnormal movements (rate only patient's report): No Awareness, Dental Status Current problems with teeth and/or dentures?: No Does patient usually wear dentures?: No  CIWA:    COWS:     Musculoskeletal: Strength & Muscle Tone: within normal limits Gait & Station: normal Patient leans: N/A  Psychiatric Specialty Exam:  Presentation  General Appearance: Appropriate for Environment; Neat  Eye Contact:Good  Speech:Clear and Coherent; Normal Rate  Speech Volume:Normal  Handedness: No data recorded  Mood and Affect  Mood:Anxious  Affect:Congruent   Thought Process  Thought Processes:Coherent  Descriptions of Associations:Intact  Orientation:Full (Time, Place and Person)  Thought Content:Logical  History of Schizophrenia/Schizoaffective disorder:No  Duration of Psychotic Symptoms:Less than six months  Hallucinations:Hallucinations: None  Ideas of Reference:None  Suicidal Thoughts:Suicidal Thoughts: No  Homicidal Thoughts:Homicidal Thoughts: No   Sensorium  Memory:Immediate Good; Recent Good  Judgment:Fair  Insight:Fair   Executive Functions  Concentration:Good  Attention Span:Good  [place] of  Knowledge:Good  Language:Good   Psychomotor Activity  Psychomotor Activity:Psychomotor Activity: Normal   Assets  Assets:Communication Skills; Desire for Improvement; Housing; Leisure Time; Physical Health; Resilience; Social Support; Transportation; Vocational/Educational   Sleep  Sleep:Sleep: Good Number of Hours of Sleep: 6.25    Physical Exam: Physical Exam Vitals and nursing note reviewed.  Constitutional:      General: She is not in acute distress.    Appearance: Normal appearance. She is not diaphoretic.  HENT:     Head: Normocephalic and atraumatic.  Pulmonary:     Effort: Pulmonary effort is normal.  Neurological:     General: No focal deficit present.     Mental Status: She is alert and oriented to person, place, and time.   Review of Systems  Constitutional:  Negative for chills, diaphoresis, fever and malaise/fatigue.  HENT:  Negative for sore throat.   Respiratory:  Negative for cough and shortness of breath.   Cardiovascular:  Negative for chest pain and palpitations.  Gastrointestinal:  Negative for constipation, diarrhea, nausea and vomiting.  Genitourinary:  Negative for dysuria.  Musculoskeletal:  Negative for myalgias.  Skin:  Negative for rash.  Neurological:  Negative for dizziness, tremors, seizures and headaches.  Psychiatric/Behavioral:  Negative for depression, hallucinations and suicidal ideas. The patient does not have insomnia.   All other systems reviewed and are negative. Blood pressure (!) 136/98, pulse (!) 105, temperature 98.2 F (36.8 C), temperature source Oral, resp. rate 17, height _0  (1.651 m), weight 61.2 kg, SpO2 99 %. Body mass index is 22.47 kg/m.   Treatment Plan Summary: Patient is a 34 year old female with prior treatment by her PCP for anxiety but no other past psychiatric history admitted with symptoms that meet criteria for major depressive episode with psychotic features versus mixed mood episode with psychotic  features and comorbid GAD with panic attacks with symptoms likely significantly exacerbated by THC/delta 8 use.  Unclear whether psychotic symptoms are entirely substance induced or due to underlying psychiatric condition.  Suspect that delta 8/THC use was significantly contributing to psychotic, anxiety and mood symptoms precipitating current admission.  Substance-induced psychotic disorder and brief psychotic disorder remain on the differential.  Patient is improved from admission and has denied any psychotic symptoms since the first day of admission to [facility].  She denies any psychotic symptoms, panic attacks, depression or significant anxiety today.  Patient reports that sleep is much improved since starting olanzapine.    Daily contact with patient to assess and evaluate symptoms and progress in treatment and Medication management  Major depressive disorder recurrent with psychotic features (rule out substance-induced psychotic disorder; rule out brief psychotic disorder) -Continue olanzapine 10 mg at bedtime for psychotic symptoms and mood stabilization -Patient continues to decline antidepressant trial at this time.  I have advised patient to follow-up with outpatient psychiatric medication management provider for further evaluation of possible depressive symptoms initiation of antidepressant if indicated.  Episodic THC/delta 8 use -I have advised cessation of use of THC and delta 8 as well as other substances.  I have discussed with patient the potential risks of substance use, including use of delta 8 and THC.  Patient states plan to refrain from future substance use.  UTI -Continue Keflex 500 mg twice daily x 7 days (day #5 of 7)  Insomnia Continue trazodone 50 mg nightly PRN  Anxiety Continue hydroxyzine 25 mg 3 times daily PRN  History of hypokalemia -Most recent potassium was 3.5 on 05/25/2021 -Repeat BMP with evening draw tonight -Discontinue Klor-Con 20 mEq twice daily  Discharge  planning in progress  Arthor Captain, MD 05/26/2021, 4:39 PM

## 2021-05-26 NOTE — [facility] Group Notes (Signed)
The focus of this group is to help patients establish daily goals to achieve during treatment and discuss how the patient can incorporate goal setting into their daily lives to aide in recovery.  Pt stated that her goal is to go home

## 2021-05-26 NOTE — Progress Notes (Signed)
Discharge Note:   Pt is alert and oriented to person, place, time and situation. Pt is calm, cooperative, denies SI/HI/AVH, denies depression and anxiety. Pt has flat affect. Pt is guarded at times, quiet, soft spoken, pleasant. Will continue to monitor pt per Q15 minute face checks and monitor for safety and progress.

## 2021-05-26 NOTE — Progress Notes (Signed)
Recreation Therapy Notes  Date:  9.5.22 Time: 0930 Location: [address]   Group Topic: Stress Management  Goal Area(s) Addresses:  Patient will identify positive stress management techniques. Patient will identify benefits of using stress management post d/c.  Behavioral Response:  Attentive  Intervention: Packet  Activity :  Patients were given a packet that covered ways to reduce stress.  It also gave examples of techniques to use to deal with stress such as progressive muscle relaxation, deep breathing and guided imagery.      Education:  Stress Management, Discharge Planning.   Education Outcome: Acknowledges Education  Clinical Observations/Feedback: Pt was attentive as the packet was discussed.      Caroll Rancher, LRT/CTRS         Lillia Abed, Shaquoya Cosper A 05/26/2021 11:09 AM

## 2021-05-27 DIAGNOSIS — F333 Major depressive disorder, recurrent, severe with psychotic symptoms: Secondary | ICD-10-CM | POA: Diagnosis not present

## 2021-05-27 DIAGNOSIS — F19959 Other psychoactive substance use, unspecified with psychoactive substance-induced psychotic disorder, unspecified: Secondary | ICD-10-CM | POA: Diagnosis present

## 2021-05-27 LAB — HEMOGLOBIN A1C
Hgb A1c MFr Bld: 4.9 % (ref 4.8–5.6)
Mean Plasma Glucose: 94 mg/dL

## 2021-05-27 NOTE — Progress Notes (Signed)
[facility] MD Progress Note  05/27/2021 3:50 PM Kerry Dixon  MRN:  [id number]  Reason for admission:  Kerry Dixon 35 year old female with history of prior treatment by her PCP for anxiety who initially presented to [facility] as a walk-in with her mother with complaints of chest pain and not sleeping for 4 days.  Patient reported symptoms of fatigue, night terrors and visions of people per chart notes.  Patient subsequently endorsed experiencing psychic premonitions about bad future events, dj vu experiences, anxiety, panic, brief paranoia and misinterpretation of events in the ED where she displayed 1 incident of aggressive behavior towards staff.  She reported using delta 8 intermittently over the past year and UDS was positive for [facility] in the ED.  Objective: Medical record reviewed.  Patient's case discussed in detail with nursing staff and members of the treatment team.  I met with and evaluated the patient on the unit today for follow-up.  Patient looks much the same as she did yesterday.  She presents with stable affect.  Thought processes are coherent and organized.  No psychotic content is elicited and she does not appear to attend or respond to internal stimuli.  Patient is cooperative and pleasant but somewhat superficial in responses.  Patient denies dj vu experiences, premonitions, fears about future catastrophes, paranoia, AH, VH or other psychotic symptoms.  She denies sad or depressed mood or significant anxiety.  Patient denies SI, AI or HI.  We again discussed possible initiation of treatment with an antidepressant for her mood or anxiety symptoms.  Patient states that she does not have symptoms of depression or anxiety and does not want to start another medication.  She feels the Zyprexa is helpful.  She is sleeping well and reports good appetite.  She denies medication side effects or physical problems.  I encouraged patient to participate in outpatient therapy and outpatient psychiatric medication  management after discharge and to also participate in substance abuse treatment program if she is unable to refrain from use of delta 8, THC or other substances on her own.  Patient stated understanding of information discussed and agreement with the aftercare plan.  I also advised patient to refrain from taking supplements to self medicate symptoms that she may experience and instead to talk to her outpatient psychiatrist or PCP for assistance.  Patient gave me permission to speak with her sister.  Staff document the patient slept 6.75 hours last night.  Vital signs today: BP 125/89, pulse 91, O2 sat 100% on room air and temperature 98.2.  Repeat CMP last night showed glucose of 159 and otherwise WNL with potassium of 4.3.  Patient is taking scheduled medications as prescribed.  She has not required any PRN medications since admission.  Patient has not had any behavioral issues on the unit.  Staff have not observed patient to respond to internal stimuli or to appear paranoid or disorganized in the unit.  Phone contact with patient's sister, Lilliah Priego: I spoke with patient's sister Asherah Lavoy by phone today with patient's permission.  Patient's sister reports that patient sounds better on the phone and is not reporting any symptoms to sister currently.  Sister provided collateral information about symptoms leading up to ED presentation and events in the ED all of which were consistent with information documented in the EMR ED notes.  I discussed patient's differential diagnosis, current symptoms, treatments, anticipated treatment outcomes, aftercare plans, etc, with sister.  Sister was provided an opportunity to ask questions and verbalize concerns.  Sister's  questions were answered.  Sister stated understanding of information discussed and expressed appreciation for the phone call.  Principal Problem: Substance-induced psychotic disorder ([place]) Diagnosis: Principal Problem:   Substance-induced  psychotic disorder ([place]) Active Problems:   MDD (major depressive disorder), recurrent, severe, with psychosis ([place])   GAD (generalized anxiety disorder)   Panic attacks  Total Time spent with patient:  25 minutes  Past Psychiatric History: See admission H&P  Past Medical History: History reviewed. No pertinent past medical history. History reviewed. No pertinent surgical history. Family History: History reviewed. No pertinent family history. Family Psychiatric  History: See admission H&P Social History:  Social History   Substance and Sexual Activity  Alcohol Use Yes   Alcohol/week: 3.0 standard drinks   Types: 3 Glasses of wine per week     Social History   Substance and Sexual Activity  Drug Use Not on file    Social History   Socioeconomic History   Marital status: Single    Spouse name: Not on file   Number of children: Not on file   Years of education: Not on file   Highest education level: Not on file  Occupational History   Not on file  Tobacco Use   Smoking status: Never   Smokeless tobacco: Not on file  Vaping Use   Vaping Use: Every day  Substance and Sexual Activity   Alcohol use: Yes    Alcohol/week: 3.0 standard drinks    Types: 3 Glasses of wine per week   Drug use: Not on file   Sexual activity: Not on file  Other Topics Concern   Not on file  Social History Narrative   Not on file   Social Determinants of Health   Financial Resource Strain: Not on file  Food Insecurity: Not on file  Transportation Needs: Not on file  Physical Activity: Not on file  Stress: Not on file  Social Connections: Not on file   Additional Social History:                         Sleep: Good  Appetite:  Good  Current Medications: Current Facility-Administered Medications  Medication Dose Route Frequency Provider Last Rate Last Admin   acetaminophen (TYLENOL) tablet 650 mg  650 mg Oral Q6H PRN Prescilla Sours, PA-C       alum & mag hydroxide-simeth  (MAALOX/MYLANTA) 200-200-20 MG/5ML suspension 30 mL  30 mL Oral Q4H PRN Margorie John W, PA-C       cephALEXin (KEFLEX) capsule 500 mg  500 mg Oral BID Margorie John W, PA-C   500 mg at 05/27/21 0831   hydrOXYzine (ATARAX/VISTARIL) tablet 25 mg  25 mg Oral TID PRN Arthor Captain, MD       OLANZapine zydis (ZYPREXA) disintegrating tablet 10 mg  10 mg Oral Q8H PRN Arthor Captain, MD       And   LORazepam (ATIVAN) tablet 1 mg  1 mg Oral PRN Arthor Captain, MD       And   ziprasidone (GEODON) injection 20 mg  20 mg Intramuscular PRN Arthor Captain, MD       magnesium hydroxide (MILK OF MAGNESIA) suspension 30 mL  30 mL Oral Daily PRN Margorie John W, PA-C       OLANZapine (ZYPREXA) tablet 10 mg  10 mg Oral QHS Arthor Captain, MD   10 mg at 05/26/21 2133   traZODone (DESYREL) tablet 50 mg  50 mg Oral QHS PRN Arthor Captain, MD        Lab Results:  Results for orders placed or performed during the hospital encounter of 05/23/21 (from the past 48 hour(s))  Resp Panel by RT-PCR (Flu A&B, Covid) Nasopharyngeal Swab     Status: None   Collection Time: 05/26/21  4:19 AM   Specimen: Nasopharyngeal Swab; Nasopharyngeal(NP) swabs in vial transport medium  Result Value Ref Range   SARS Coronavirus 2 by RT PCR NEGATIVE NEGATIVE    Comment: (NOTE) SARS-CoV-2 target nucleic acids are NOT DETECTED.  The SARS-CoV-2 RNA is generally detectable in upper respiratory specimens during the acute phase of infection. The lowest concentration of SARS-CoV-2 viral copies this assay can detect is 138 copies/mL. A negative result does not preclude SARS-Cov-2 infection and should not be used as the sole basis for treatment or other patient management decisions. A negative result may occur with  improper specimen collection/handling, submission of specimen other than nasopharyngeal swab, presence of viral mutation(s) within the areas targeted by this assay, and inadequate number of viral copies(<138 copies/mL). A  negative result must be combined with clinical observations, patient history, and epidemiological information. The expected result is Negative.  Fact Sheet for Patients:  EntrepreneurPulse.com.au  Fact Sheet for Healthcare Providers:  IncredibleEmployment.be  This test is no t yet approved or cleared by the [place] FDA and  has been authorized for detection and/or diagnosis of SARS-CoV-2 by FDA under an Emergency Use Authorization (EUA). This EUA will remain  in effect (meaning this test can be used) for the duration of the COVID-19 declaration under Section 564(b)(1) of the Act, 21 U.S.C.section 360bbb-3(b)(1), unless the authorization is terminated  or revoked sooner.       Influenza A by PCR NEGATIVE NEGATIVE   Influenza B by PCR NEGATIVE NEGATIVE    Comment: (NOTE) The Xpert Xpress SARS-CoV-2/FLU/RSV plus assay is intended as an aid in the diagnosis of influenza from Nasopharyngeal swab specimens and should not be used as a sole basis for treatment. Nasal washings and aspirates are unacceptable for Xpert Xpress SARS-CoV-2/FLU/RSV testing.  Fact Sheet for Patients: EntrepreneurPulse.com.au  Fact Sheet for Healthcare Providers: IncredibleEmployment.be  This test is not yet approved or cleared by the [place] FDA and has been authorized for detection and/or diagnosis of SARS-CoV-2 by FDA under an Emergency Use Authorization (EUA). This EUA will remain in effect (meaning this test can be used) for the duration of the COVID-19 declaration under Section 564(b)(1) of the Act, 21 U.S.C. section 360bbb-3(b)(1), unless the authorization is terminated or revoked.  Performed at [facility], [place] [address], [place], [place] [address]   Basic metabolic panel     Status: Abnormal   Collection Time: 05/26/21  6:26 PM  Result Value Ref Range   Sodium 139 135 - 145 mmol/L    Potassium 4.3 3.5 - 5.1 mmol/L    Comment: DELTA CHECK NOTED   Chloride 106 98 - 111 mmol/L   CO2 26 22 - 32 mmol/L   Glucose, Bld 159 (H) 70 - 99 mg/dL    Comment: Glucose reference range applies only to samples taken after fasting for at least 8 hours.   BUN 10 6 - 20 mg/dL   Creatinine, Ser 0.72 0.44 - 1.00 mg/dL   Calcium 9.4 8.9 - 10.3 mg/dL   GFR, Estimated >60 >60 mL/min    Comment: (NOTE) Calculated using the CKD-EPI Creatinine Equation (2021)    Anion gap 7  5 - 15    Comment: Performed at [facility], [place] [address], [place], [place] [address]    Blood Alcohol level:  Lab Results  Component Value Date   ETH <10 59/56/3875    Metabolic Disorder Labs: Lab Results  Component Value Date   HGBA1C 4.9 05/23/2021   MPG 94 05/23/2021   No results found for: PROLACTIN Lab Results  Component Value Date   CHOL 219 (H) 05/23/2021   TRIG 53 05/23/2021   HDL 64 05/23/2021   CHOLHDL 3.4 05/23/2021   VLDL 11 05/23/2021   LDLCALC 144 (H) 05/23/2021    Physical Findings: AIMS: Facial and Oral Movements Muscles of Facial Expression: None, normal Lips and Perioral Area: None, normal Jaw: None, normal Tongue: None, normal,Extremity Movements Upper (arms, wrists, hands, fingers): None, normal Lower (legs, knees, ankles, toes): None, normal, Trunk Movements Neck, shoulders, hips: None, normal, Overall Severity Severity of abnormal movements (highest score from questions above): None, normal Incapacitation due to abnormal movements: None, normal Patient's awareness of abnormal movements (rate only patient's report): No Awareness, Dental Status Current problems with teeth and/or dentures?: No Does patient usually wear dentures?: No  CIWA:    COWS:     Musculoskeletal: Strength & Muscle Tone: within normal limits Gait & Station: normal Patient leans: N/A  Psychiatric Specialty Exam:  Presentation  General Appearance: Appropriate for Environment;  Neat  Eye Contact:Good  Speech:Clear and Coherent  Speech Volume:Normal  Handedness: No data recorded  Mood and Affect  Mood:Anxious  Affect:Congruent   Thought Process  Thought Processes:Coherent; Goal Directed  Descriptions of Associations:Intact  Orientation:Full (Time, Place and Person)  Thought Content:Logical; WDL  History of Schizophrenia/Schizoaffective disorder:No  Duration of Psychotic Symptoms:Less than six months  Hallucinations:Hallucinations: None  Ideas of Reference:None  Suicidal Thoughts:Suicidal Thoughts: No  Homicidal Thoughts:Homicidal Thoughts: No   Sensorium  Memory:Immediate Good; Recent Good  Judgment:Fair  Insight:Fair   Executive Functions  Concentration:Good  Attention Span:Good  [place] of Knowledge:Good  Language:Good   Psychomotor Activity  Psychomotor Activity:Psychomotor Activity: Normal   Assets  Assets:Communication Skills; Desire for Improvement; Housing; Leisure Time; Physical Health; Resilience; Social Support; Transportation; Vocational/Educational   Sleep  Sleep:Sleep: Good Number of Hours of Sleep: 6.75    Physical Exam: Physical Exam Vitals and nursing note reviewed.  Constitutional:      General: She is not in acute distress.    Appearance: Normal appearance. She is not diaphoretic.  HENT:     Head: Normocephalic and atraumatic.  Cardiovascular:     Rate and Rhythm: Normal rate.  Pulmonary:     Effort: Pulmonary effort is normal.  Neurological:     General: No focal deficit present.     Mental Status: She is alert and oriented to person, place, and time.   Review of Systems  Constitutional:  Negative for chills, diaphoresis, fever and malaise/fatigue.  HENT:  Negative for sore throat.   Respiratory:  Negative for cough and shortness of breath.   Cardiovascular:  Negative for chest pain and palpitations.  Gastrointestinal:  Negative for constipation, diarrhea, nausea and  vomiting.  Genitourinary:  Negative for dysuria.  Musculoskeletal:  Negative for myalgias.  Skin:  Negative for rash.  Neurological:  Negative for dizziness, tremors, seizures and headaches.  Psychiatric/Behavioral:  Negative for depression, hallucinations and suicidal ideas. The patient is nervous/anxious. The patient does not have insomnia.   All other systems reviewed and are negative. Blood pressure 125/89, pulse 91, temperature 98.2 F (36.8  C), temperature source Oral, resp. rate 16, height 5' 5" (1.651 m), weight 61.2 kg, SpO2 100 %. Body mass index is 22.47 kg/m.   Treatment Plan Summary: Patient is a 35 year old female with prior treatment by her PCP for anxiety but no other past psychiatric history admitted with symptoms that initially appeared consistent with diagnosis of major depressive episode with psychotic features versus mixed mood episode with psychotic features and comorbid GAD with panic attacks with symptoms likely significantly exacerbated by THC/delta 8 use.  Patient's presenting symptoms now appear more consistent with substance-induced psychotic disorder since they have remitted quickly with increased passage of time time since last delta 8/THC use as well as with Zyprexa treatment.  Patient did previously seek treatment for depression and anxiety from her PCP and may also have independent MDD and GAD diagnoses.  She continues to decline treatment for depression and anxiety and states desire to continue treatment on Zyprexa alone.  Cannot rule out possibility of primary psychotic disorder exacerbated by substance use.  Patient is improved from admission and has denied any psychotic symptoms since the first day of admission to [facility].  She denies any psychotic symptoms, panic attacks, depression or significant anxiety today.  She continues to deny SI, AI or HI.  She is sleeping and eating well and is nearing readiness for discharge.  Daily contact with patient to assess and  evaluate symptoms and progress in treatment and Medication management  Substance-induced psychotic disorder versus major depressive disorder recurrent with psychotic features -Continue olanzapine 10 mg at bedtime for psychotic symptoms and mood stabilization -Patient continues to decline antidepressant trial at this time.  I have advised patient to follow-up with outpatient psychiatric medication management provider for further evaluation of possible depressive symptoms initiation of antidepressant if indicated.  Episodic THC/delta 8 use -I have advised cessation of use of THC and delta 8 as well as other substances.  I have discussed with patient the potential risks of substance use, including use of delta 8 and THC.  Patient states plan to refrain from future substance use. -Social work will provide patient with substance abuse treatment program information   UTI -Continue Keflex 500 mg twice daily x 7 days (day #5 of 7)  Insomnia Continue trazodone 50 mg nightly PRN  Anxiety Continue hydroxyzine 25 mg 3 times daily PRN  History of hypokalemia - resolved -Repeat potassium 4.3 with evening draw on 05/26/2021 up from 3.5 on 05/25/2021  Discharge planning in progress  Arthor Captain, MD 05/27/2021, 3:50 PM

## 2021-05-27 NOTE — Progress Notes (Signed)
Recreation Therapy Notes  Date: 9.6.22 Time: 0930 Location: [address]  Group Topic: Anxiety  Goal Area(s) Addresses:  Patient will identify what triggers anxiety. Patient will identify symptoms of anxiety. Patient will identify coping skills for anxiety.  Behavioral Response: Engaged  Intervention: Worksheet  Activity:  LRT met with each patient 1:1 to discuss how they are affected by anxiety and the coping skills used when dealing anxiety.  Education: Radiographer, therapeutic, Dentist.   Education Outcome: Acknowledges understanding/In group clarification offered/Needs additional education.   Clinical Observations/Feedback: Pt participated in answering questions dealing with being anxious.  Pt identified triggers to anxiety as stress and work.  Symptoms experienced are panic attacks and heart beating really fast.  Coping skills used were going outside, walking, listening to music, talk to family and taking ashawaghanda supplement.    Victorino Sparrow, LRT/CTRS    Victorino Sparrow A 05/27/2021 11:39 AM

## 2021-05-27 NOTE — Progress Notes (Signed)
Pt visible at medication window on initial contact. Presents with fair eye contact, congruent affect, soft logical speech, ambulatory in milieu with a steady gait. Reports she slept well last night with good appetite, normal energy and good concentration level. Rates her hopelessness 4/10 "Just about getting out of here", depression and anxiety both 0/10. Pt's goal for today is to "Listen to the doctor and stop taking delta 8". Pt is compliant with current medications and denies adverse drug reactions at this time.  Emotional support offered to pt this shift. Q 15 minutes safety checks maintained on unit without self harm gestures or outburst to not thus far. All medications given with verbal education and effects monitored. Pt encouraged to comply with current treatment regimen and to verbalize his concerns. Pt remains safe on unit. Tolerates meals and fluids well. Observed interacting well with others in milieu. Engaged in 1:1 group sessions this shift. Denies discomfort at this time.

## 2021-05-27 NOTE — Progress Notes (Signed)
Pt was given daily packet and discussed the goal for today.  Pt states that she is feeling good and is ready to return home.

## 2021-05-28 ENCOUNTER — Encounter (HOSPITAL_COMMUNITY): Payer: Self-pay

## 2021-05-28 DIAGNOSIS — F19959 Other psychoactive substance use, unspecified with psychoactive substance-induced psychotic disorder, unspecified: Secondary | ICD-10-CM | POA: Diagnosis not present

## 2021-05-28 LAB — RESP PANEL BY RT-PCR (FLU A&B, COVID) ARPGX2
Influenza A by PCR: NEGATIVE
Influenza B by PCR: NEGATIVE
SARS Coronavirus 2 by RT PCR: NEGATIVE

## 2021-05-28 MED ORDER — TRAZODONE HCL 50 MG PO TABS: 50.0000 mg | ORAL_TABLET | Freq: Every evening | ORAL | 0 refills | Status: AC | PRN

## 2021-05-28 MED ORDER — OLANZAPINE 10 MG PO TABS: 10.0000 mg | ORAL_TABLET | Freq: Every day | ORAL | 0 refills | Status: AC

## 2021-05-28 MED ORDER — HYDROXYZINE HCL 25 MG PO TABS: 25.0000 mg | ORAL_TABLET | Freq: Three times a day (TID) | ORAL | 0 refills | Status: AC | PRN

## 2021-05-28 MED ORDER — TRAZODONE HCL 50 MG PO TABS
50.0000 mg | ORAL_TABLET | Freq: Every evening | ORAL | Status: DC | PRN
Start: 2021-05-28 — End: 2021-05-28
  Filled 2021-05-28: qty 7

## 2021-05-28 MED ORDER — CEPHALEXIN 500 MG PO CAPS: 500.0000 mg | ORAL_CAPSULE | Freq: Two times a day (BID) | ORAL | Status: AC

## 2021-05-28 NOTE — Progress Notes (Signed)
D:  Patient denied SI and HI, contracts for safety.  Denied A/V hallucinations. Denied pain. A:  Medications administered per MD orders.  Emotional support and encouragement given patient. R:  Safety maintained with 15 minute checks. SW talked to patient's sister about today's discharge.

## 2021-05-28 NOTE — Progress Notes (Signed)
DAR Note: Patient calm and cooperative, denies SI/HI/AVH, verbalizes readiness for discharge, and denies any current concerns. Q15 minute checks being maintained for safety.

## 2021-05-28 NOTE — Group Note (Signed)
Recreation Therapy Group Note   Group Topic:Self-Esteem  Group Date: 05/28/2021 Start Time: 1000 End Time: 1040 Facilitators: Caroll Rancher, LRT/CTRS Location:  [address] Hall   Goal Area(s) Addresses:  Patient will appropriately identify what self esteem is.  Patient will create a shield of armor describing themselves.  Patient will successfully identify positive attributes about themselves.  Patient will acknowledge benefit of improved self-esteem.  Patient will follow instructions on 1st prompt.   Intervention / Activity: Self-Esteem Shield. Patient engaged in 1:1 session LRT that focused on self esteem. Patient identified what self esteem is.  LRT gave handout to patient of a blank shield and an explanation of what was to go in each quadrant. Patient was asked to create their own shield to show off their unique attributes, four quadrants reflected the following:   The Upper Left quadrant- two people or things you value. The Upper Right quadrant- two lessons you have learned thus far in life. The Lower Left quadrant- three qualities that make you unique. The Lower Right quadrant- one goal you want to work towards.    Patients were provided sheets with the shield printed on them and materials to complete the assignment.   Education: Self esteem, Communication, Positive self-talk, Discharge Planning    Affect/Mood: Happy   Participation Level: Engaged   Participation Quality: Independent   Behavior: Attentive    Speech/Thought Process: Focused   Insight: Good   Judgement: Good   Modes of Intervention: 1:1 activity with LRT due to COVID on unit   Patient Response to Interventions:  Engaged   Education Outcome:  Acknowledges education   Clinical Observations/Individualized Feedback:  Pt identified the people and things she values as family and being outside.  Two lessons pt has learned were to take walks and get some rest.  Pt expressed her three qualities were being  friendly, being pretty and being a good mom.  One goal pt wants to work towards is taking breaths when stressed.  Plan: Continue to engage patient in RT group sessions 2-3x/week.   Caroll Rancher, LRT/CTRS  05/28/2021 2:20 PM

## 2021-05-28 NOTE — BH IP Treatment Plan (Signed)
Interdisciplinary Treatment and Diagnostic Plan Update  05/28/2021 Time of Session: 9:25am Kerry Dixon MRN: [id number]  Principal Diagnosis: Substance-induced psychotic disorder [facility])  Secondary Diagnoses: Principal Problem:   Substance-induced psychotic disorder (HCC) Active Problems:   MDD (major depressive disorder), recurrent, severe, with psychosis (HCC)   GAD (generalized anxiety disorder)   Panic attacks   Current Medications:  Current Facility-Administered Medications  Medication Dose Route Frequency Provider Last Rate Last Admin   acetaminophen (TYLENOL) tablet 650 mg  650 mg Oral Q6H PRN Jaclyn Shaggy, PA-C       alum & mag hydroxide-simeth (MAALOX/MYLANTA) 200-200-20 MG/5ML suspension 30 mL  30 mL Oral Q4H PRN Melbourne Abts W, PA-C       cephALEXin (KEFLEX) capsule 500 mg  500 mg Oral BID Melbourne Abts W, PA-C   500 mg at 05/28/21 9449   hydrOXYzine (ATARAX/VISTARIL) tablet 25 mg  25 mg Oral TID PRN Claudie Revering, MD       OLANZapine zydis (ZYPREXA) disintegrating tablet 10 mg  10 mg Oral Q8H PRN Claudie Revering, MD       And   LORazepam (ATIVAN) tablet 1 mg  1 mg Oral PRN Claudie Revering, MD       And   ziprasidone (GEODON) injection 20 mg  20 mg Intramuscular PRN Claudie Revering, MD       magnesium hydroxide (MILK OF MAGNESIA) suspension 30 mL  30 mL Oral Daily PRN Melbourne Abts W, PA-C       OLANZapine (ZYPREXA) tablet 10 mg  10 mg Oral QHS Claudie Revering, MD   10 mg at 05/27/21 2121   traZODone (DESYREL) tablet 50 mg  50 mg Oral QHS PRN Claudie Revering, MD       PTA Medications: Medications Prior to Admission  Medication Sig Dispense Refill Last Dose   ASHWAGANDHA PO Take 1 capsule by mouth daily.      buPROPion (WELLBUTRIN) 75 MG tablet Take 75 mg by mouth 2 (two) times daily. (Patient not taking: No sig reported)      hydrOXYzine (ATARAX/VISTARIL) 10 MG tablet Take 10 mg by mouth 2 (two) times daily.       Patient Stressors: Marital or family conflict     Patient Strengths: Ability for insight  Active sense of humor  Average or above average intelligence  Communication skills  Supportive family/friends  Work skills   Treatment Modalities: Medication Management, Group therapy, Case management,  1 to 1 session with clinician, Psychoeducation, Recreational therapy.   Physician Treatment Plan for Primary Diagnosis: Substance-induced psychotic disorder (HCC) Long Term Goal(s): Improvement in symptoms so as ready for discharge   Short Term Goals: Ability to identify changes in lifestyle to reduce recurrence of condition will improve Ability to verbalize feelings will improve Ability to disclose and discuss suicidal ideas Ability to demonstrate self-control will improve Ability to identify and develop effective coping behaviors will improve Ability to maintain clinical measurements within normal limits will improve Compliance with prescribed medications will improve Ability to identify triggers associated with substance abuse/mental health issues will improve  Medication Management: Evaluate patient's response, side effects, and tolerance of medication regimen.  Therapeutic Interventions: 1 to 1 sessions, Unit Group sessions and Medication administration.  Evaluation of Outcomes: Adequate for Discharge  Physician Treatment Plan for Secondary Diagnosis: Principal Problem:   Substance-induced psychotic disorder (HCC) Active Problems:   MDD (major depressive disorder), recurrent, severe, with psychosis (HCC)   GAD (generalized anxiety disorder)  Panic attacks  Long Term Goal(s): Improvement in symptoms so as ready for discharge   Short Term Goals: Ability to identify changes in lifestyle to reduce recurrence of condition will improve Ability to verbalize feelings will improve Ability to disclose and discuss suicidal ideas Ability to demonstrate self-control will improve Ability to identify and develop effective coping behaviors  will improve Ability to maintain clinical measurements within normal limits will improve Compliance with prescribed medications will improve Ability to identify triggers associated with substance abuse/mental health issues will improve     Medication Management: Evaluate patient's response, side effects, and tolerance of medication regimen.  Therapeutic Interventions: 1 to 1 sessions, Unit Group sessions and Medication administration.  Evaluation of Outcomes: Adequate for Discharge   RN Treatment Plan for Primary Diagnosis: Substance-induced psychotic disorder (HCC) Long Term Goal(s): Knowledge of disease and therapeutic regimen to maintain health will improve  Short Term Goals: Ability to remain free from injury will improve, Ability to participate in decision making will improve, Ability to verbalize feelings will improve, Ability to disclose and discuss suicidal ideas, and Ability to identify and develop effective coping behaviors will improve  Medication Management: RN will administer medications as ordered by provider, will assess and evaluate patient's response and provide education to patient for prescribed medication. RN will report any adverse and/or side effects to prescribing provider.  Therapeutic Interventions: 1 on 1 counseling sessions, Psychoeducation, Medication administration, Evaluate responses to treatment, Monitor vital signs and CBGs as ordered, Perform/monitor CIWA, COWS, AIMS and Fall Risk screenings as ordered, Perform wound care treatments as ordered.  Evaluation of Outcomes: Adequate for Discharge   LCSW Treatment Plan for Primary Diagnosis: Substance-induced psychotic disorder [facility]) Long Term Goal(s): Safe transition to appropriate next level of care at discharge, Engage patient in therapeutic group addressing interpersonal concerns.  Short Term Goals: Engage patient in aftercare planning with referrals and resources, Increase social support, Increase emotional  regulation, Facilitate acceptance of mental health diagnosis and concerns, Identify triggers associated with mental health/substance abuse issues, and Increase skills for wellness and recovery  Therapeutic Interventions: Assess for all discharge needs, 1 to 1 time with Social worker, Explore available resources and support systems, Assess for adequacy in community support network, Educate family and significant other(s) on suicide prevention, Complete Psychosocial Assessment, Interpersonal group therapy.  Evaluation of Outcomes: Adequate for Discharge   Progress in Treatment: Attending groups: Yes. Participating in groups: Yes. Taking medication as prescribed: Yes. Toleration medication: Yes. Family/Significant other contact made: Yes, individual(s) contacted:  Sister Patient understands diagnosis: Yes. Discussing patient identified problems/goals with staff: Yes. Medical problems stabilized or resolved: Yes. Denies suicidal/homicidal ideation: Yes. Issues/concerns per patient self-inventory: No.   New problem(s) identified: No, Describe:  None   New Short Term/Long Term Goal(s): medication stabilization, elimination of SI thoughts, development of comprehensive mental wellness plan.   Patient Goals: "To go home"  Discharge Plan or Barriers: Pt to return home and follow up at [facility] for therapy and medication management   Reason for Continuation of Hospitalization: Medication stabilization  Estimated Length of Stay: Adequate for discharge    Scribe for Treatment Team: Catha Brow 05/28/2021 2:40 PM

## 2021-05-28 NOTE — Progress Notes (Signed)
Discharge Note:  Patient discharged home with significant other.  Suicide prevention information given and discussed with patient who stated she understood and had no questions.  Patient stated she received all her belongings, clothing, toiletries, misc items, etc.  Patient denied SI and HI.  Denied A/V hallucinations.  Patient stated she appreciated all assistance received from [facility] staff.  All required discharge information given.

## 2021-05-28 NOTE — Progress Notes (Signed)
  [facility] Adult Case Management Discharge Plan :  Will you be returning to the same living situation after discharge:  Yes,  Home  At discharge, do you have transportation home?: Yes,  Boyfriend  Do you have the ability to pay for your medications: Yes,  Family   Release of information consent forms completed and in the chart;  Patient's signature needed at discharge.  Patient to Follow up at:  Follow-up Information     [facility] Follow up on 05/30/2021.   Why: You are scheduled for an intake appointment on 05/30/2021 at 1:00pm.  This appointment is in person and you will need to bring your ID.  Plan to be there for 2 hours to complete intake paperwork. This facility offers therapy, medication management and substance use resources. Contact information: [address] [place] [address] [phone]                 Next level of care provider has access to [facility] Link:no  Safety Planning and Suicide Prevention discussed: Yes,  with patient and sister      Has patient been referred to the Quitline?: N/A patient is not a smoker  Patient has been referred for addiction treatment: N/A  Aram Beecham, LCSWA 05/28/2021, 11:29 AM

## 2021-05-28 NOTE — Progress Notes (Signed)
   05/28/21 0048  Psych Admission Type (Psych Patients Only)  Admission Status Voluntary  Psychosocial Assessment  Patient Complaints None  Eye Contact Brief  Facial Expression Flat  Affect Depressed  Speech Logical/coherent  Interaction Cautious  Motor Activity Slow  Appearance/Hygiene Unremarkable  Behavior Characteristics Cooperative;Appropriate to situation  Mood Pleasant;Euthymic  Aggressive Behavior  Targets  (no aggressive behaviors exhibited this shift)  Thought Process  Coherency WDL  Content WDL  Delusions None reported or observed  Perception WDL  Hallucination None reported or observed  Judgment Limited  Confusion None  Danger to Self  Current suicidal ideation? Denies  Danger to Others  Danger to Others None reported or observed

## 2021-05-28 NOTE — [facility] Suicide Risk Assessment (Signed)
[facility] INPATIENT:  Family/Significant Other Suicide Prevention Education  Suicide Prevention Education:  Education Completed; Kerry Dixon [phone] (Sister) has been identified by the patient as the family member/significant other with whom the patient will be residing, and identified as the person(s) who will aid the patient in the event of a mental health crisis (suicidal ideations/suicide attempt).  With written consent from the patient, the family member/significant other has been provided the following suicide prevention education, prior to the and/or following the discharge of the patient.  The suicide prevention education provided includes the following: Suicide risk factors Suicide prevention and interventions National Suicide Hotline telephone number [facility] assessment telephone number [facility] Emergency Assistance 911 [facility] and/or Residential Mobile Crisis Unit telephone number  Request made of family/significant other to: Remove weapons (e.g., guns, rifles, knives), all items previously/currently identified as safety concern.   Remove drugs/medications (over-the-counter, prescriptions, illicit drugs), all items previously/currently identified as a safety concern.  The family member/significant other verbalizes understanding of the suicide prevention education information provided.  The family member/significant other agrees to remove the items of safety concern listed above.  CSW spoke with Kerry Dixon who states that she is close with her sister and speaks with her regularly.  Kerry Dixon states that her sister does not have access to any weapons or firearms.  Kerry Dixon states that she will continue to be a support for her sister after her discharge from the hospital.  CSW completed SPE with Kerry Dixon.   Kerry Dixon 05/28/2021, 9:25 AM

## 2021-05-28 NOTE — [facility] Suicide Risk Assessment (Signed)
[facility] Discharge Suicide Risk Assessment   Principal Problem: Substance-induced psychotic disorder [facility]) Discharge Diagnoses: Principal Problem:   Substance-induced psychotic disorder (HCC) Active Problems:   MDD (major depressive disorder), recurrent, severe, with psychosis (HCC)   GAD (generalized anxiety disorder)   Panic attacks   Total Time spent with patient: 20 minutes  Musculoskeletal: Strength & Muscle Tone: within normal limits Gait & Station: normal Patient leans: N/A  Psychiatric Specialty Exam  Presentation  General Appearance: Appropriate for Environment; Neat  Eye Contact:Good  Speech:Clear and Coherent  Speech Volume:Normal  Handedness: No data recorded  Mood and Affect  Mood:Anxious  Duration of Depression Symptoms: Greater than two weeks  Affect:Congruent   Thought Process  Thought Processes:Coherent; Goal Directed  Descriptions of Associations:Intact  Orientation:Full (Time, Place and Person)  Thought Content:Logical; WDL  History of Schizophrenia/Schizoaffective disorder:No  Duration of Psychotic Symptoms:Less than six months  Hallucinations:Hallucinations: None  Ideas of Reference:None  Suicidal Thoughts:Suicidal Thoughts: No  Homicidal Thoughts:Homicidal Thoughts: No   Sensorium  Memory:Immediate Good; Recent Good; Remote Good  Judgment:Fair  Insight:Fair   Executive Functions  Concentration:Good  Attention Span:Good  Recall:Good  Fund of Knowledge:Good  Language:Good   Psychomotor Activity  Psychomotor Activity:Psychomotor Activity: Normal   Assets  Assets:Communication Skills; Desire for Improvement; Housing; Physical Health; Resilience; Social Support; Transportation; Vocational/Educational   Sleep  Sleep:Sleep: Good Number of Hours of Sleep: 6.25   Physical Exam: Physical Exam Vitals and nursing note reviewed.  Constitutional:      General: She is not in acute distress.    Appearance: Normal  appearance. She is not diaphoretic.  Cardiovascular:     Rate and Rhythm: Normal rate.  Pulmonary:     Effort: Pulmonary effort is normal.  Neurological:     General: No focal deficit present.     Mental Status: She is alert and oriented to person, place, and time.   Review of Systems  Constitutional: Negative.  Negative for chills, diaphoresis and fever.  HENT: Negative.  Negative for sore throat.   Respiratory: Negative.  Negative for cough and shortness of breath.   Cardiovascular: Negative.  Negative for chest pain and palpitations.  Gastrointestinal: Negative.  Negative for constipation, diarrhea, nausea and vomiting.  Genitourinary: Negative.   Musculoskeletal: Negative.  Negative for myalgias.  Neurological: Negative.  Negative for dizziness and tremors.  Psychiatric/Behavioral:  Negative for depression, hallucinations and suicidal ideas. The patient is nervous/anxious. The patient does not have insomnia.   All other systems reviewed and are negative. Blood pressure (!) 125/94, pulse 100, temperature 98 F (36.7 C), temperature source Oral, resp. rate 16, height 5\' 5"  (1.651 m), weight 61.2 kg, SpO2 100 %. Body mass index is 22.47 kg/m.  Mental Status Per Nursing Assessment::   On Admission:  NA  Demographic Factors:  NA  Loss Factors: NA  Historical Factors: Family history of mental illness or substance abuse  Risk Reduction Factors:   Responsible for children under 3 years of age, Sense of responsibility to family, Employed, Living with another person, especially a relative, Positive social support, Future oriented outlook, and Positive coping skills or problem solving skills  Continued Clinical Symptoms:  Anxiety -  improved Depression - improved Alcohol/Substance Use/Abuse/Dependencies  Cognitive Features That Contribute To Risk:  None    Suicide Risk:  Minimal acute risk: No identifiable suicidal ideation.  Patients presenting with no risk factors but with  morbid ruminations; may be classified as minimal risk based on the severity of the depressive symptoms  Follow-up Information     [facility] Follow up on 05/30/2021.   Why: You are scheduled for an intake appointment on 05/30/2021 at 1:00pm.  This appointment is in person and you will need to bring your ID.  Plan to be there for 2 hours to complete intake paperwork. This facility offers therapy, medication management and substance use resources. Contact information: [address] [place] [address] [phone]                 Plan Of Care/Follow-up recommendations:  Activity:  as tolerated  Tests:  You will periodically need to have blood drawn for lab work to monitor your cholesterol and blood sugar while you are taking Zyprexa/olanzapine.  Your outpatient doctor will let you know when lab work needs to be performed.  Other:   -Take medications as prescribed.   -Do not drink alcohol.  Do not use marijuana/cannabis or other drugs.  Attend substance abuse treatment program if you are unable to refrain from use of alcohol, marijuana/cannabis or other drugs.   -Keep outpatient mental health follow-up appointments with therapist and psychiatrist.   -See your primary care provider for treatment of medical conditions.     Claudie Revering, MD 05/28/2021, 10:48 AM

## 2021-05-28 NOTE — Plan of Care (Signed)
Nurse discussed coping skills with patient.  
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
## 2021-05-30 DIAGNOSIS — F411 Generalized anxiety disorder: Secondary | ICD-10-CM | POA: Diagnosis not present

## 2021-06-11 DIAGNOSIS — F411 Generalized anxiety disorder: Secondary | ICD-10-CM | POA: Diagnosis not present

## 2021-06-17 DIAGNOSIS — F411 Generalized anxiety disorder: Secondary | ICD-10-CM | POA: Diagnosis not present

## 2021-06-23 DIAGNOSIS — F411 Generalized anxiety disorder: Secondary | ICD-10-CM | POA: Diagnosis not present

## 2021-06-25 DIAGNOSIS — Z79899 Other long term (current) drug therapy: Secondary | ICD-10-CM | POA: Diagnosis not present

## 2021-06-25 DIAGNOSIS — Z6822 Body mass index (BMI) 22.0-22.9, adult: Secondary | ICD-10-CM | POA: Diagnosis not present

## 2021-06-25 DIAGNOSIS — Z23 Encounter for immunization: Secondary | ICD-10-CM | POA: Diagnosis not present

## 2021-06-25 DIAGNOSIS — E785 Hyperlipidemia, unspecified: Secondary | ICD-10-CM | POA: Diagnosis not present

## 2021-06-25 DIAGNOSIS — G4700 Insomnia, unspecified: Secondary | ICD-10-CM | POA: Diagnosis not present

## 2021-07-10 DIAGNOSIS — F411 Generalized anxiety disorder: Secondary | ICD-10-CM | POA: Diagnosis not present

## 2023-04-28 IMAGING — DX DG CHEST 1V
1 series · 1 of 1 positions shown · non-contrast
Comparison: None.

CLINICAL DATA: Chest pain.

EXAM:
CHEST  1 VIEW

[chest ap]
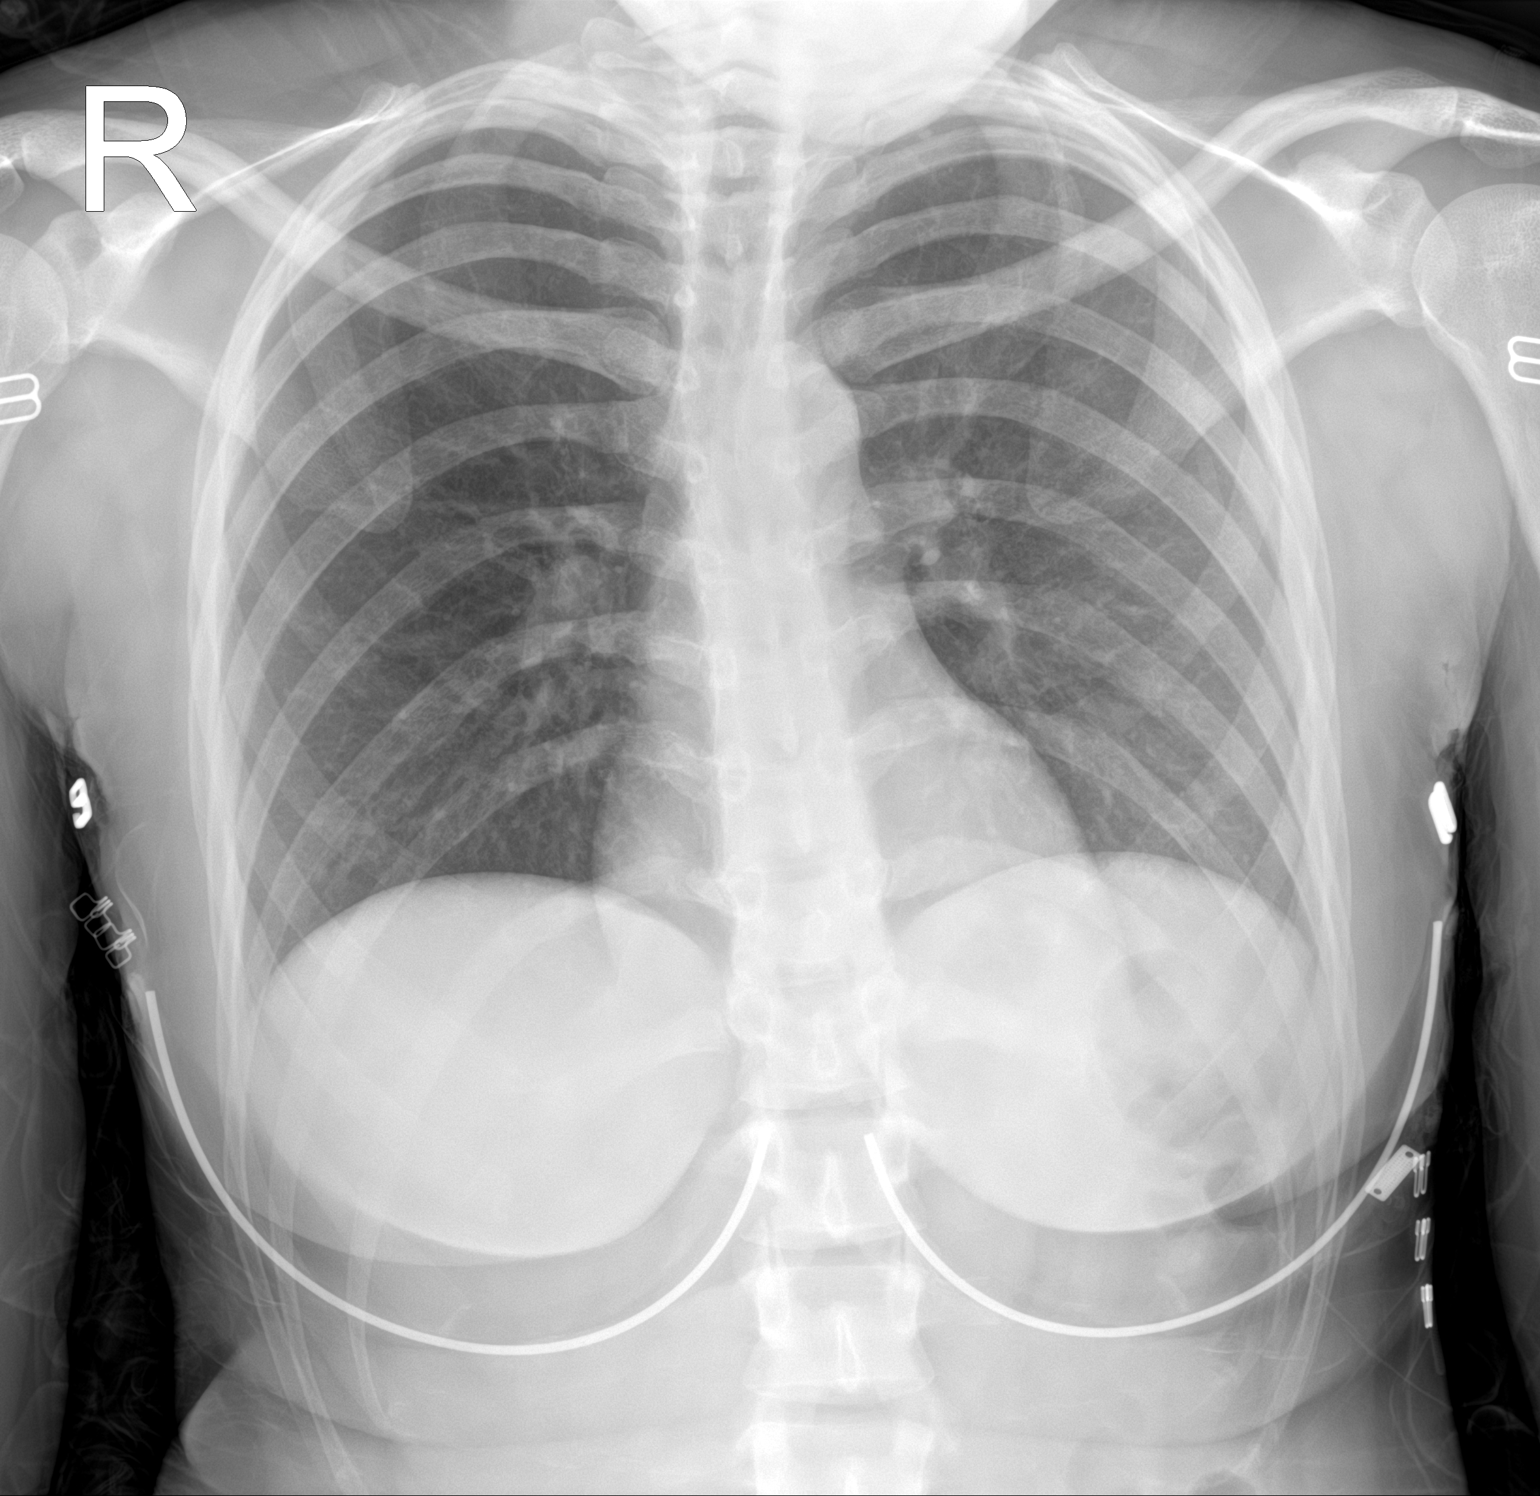

[1 of 1 positions shown; findings below may reference images not displayed]

FINDINGS: The heart size and mediastinal contours are within normal limits.
Both lungs are clear. The visualized skeletal structures are
unremarkable.
IMPRESSION: No active disease.

## 2023-04-29 IMAGING — CT CT HEAD W/O CM
3 series · 15 of 47 positions shown, 18 images · non-contrast
Comparison: None.

CLINICAL DATA: Encephalopathy

EXAM:
CT HEAD WITHOUT CONTRAST
TECHNIQUE: Contiguous axial images were obtained from the base of the skull
through the vertex without intravenous contrast.

[Series 4: head 5.0 h30s · axial · 0.44mm/px · z∈[+1134,+1269]mm · 9 of 33 slices shown, 12 images]
[im 3/33  brain]
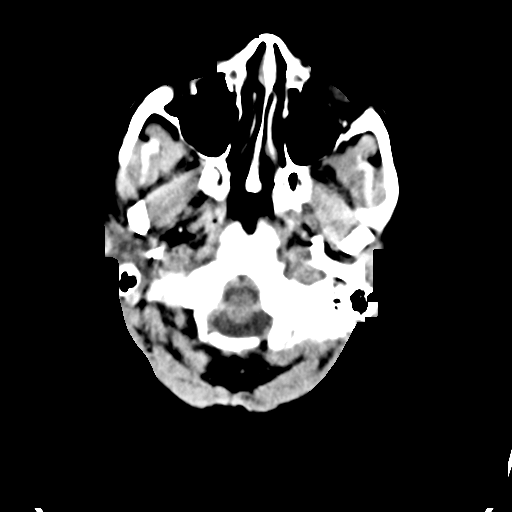
[im 3/33  bone]
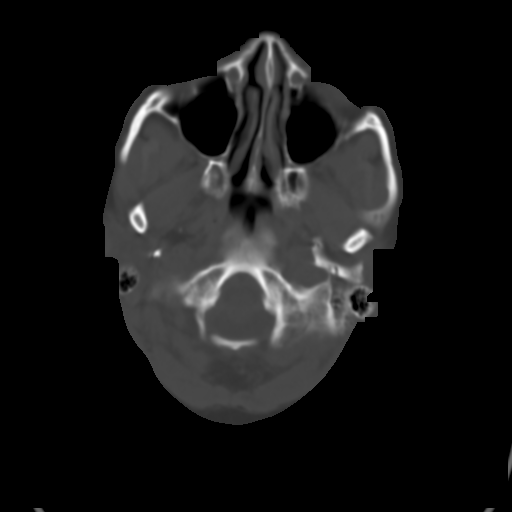
[im 6/33  brain]
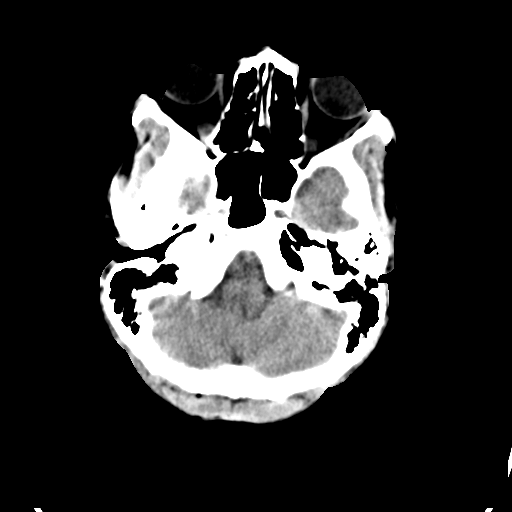
[im 9/33  brain]
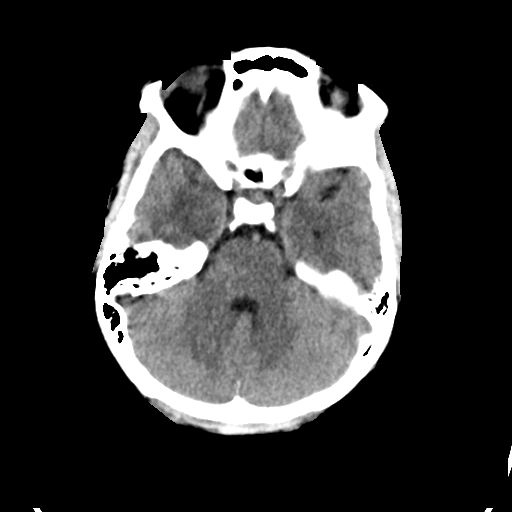
[im 13/33  brain]
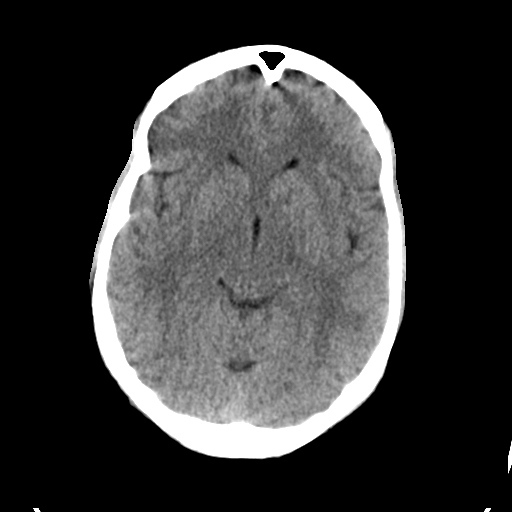
[im 17/33  brain]
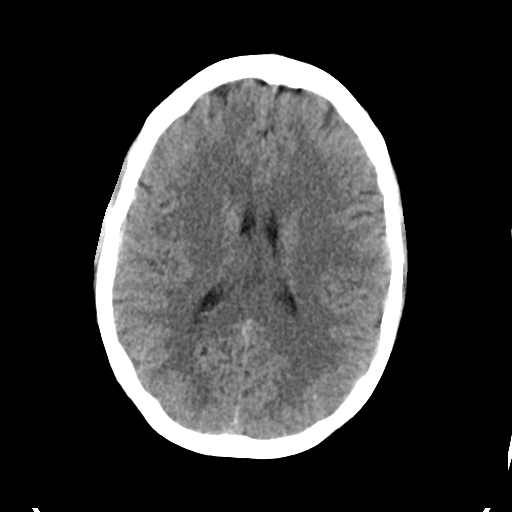
[im 17/33  bone]
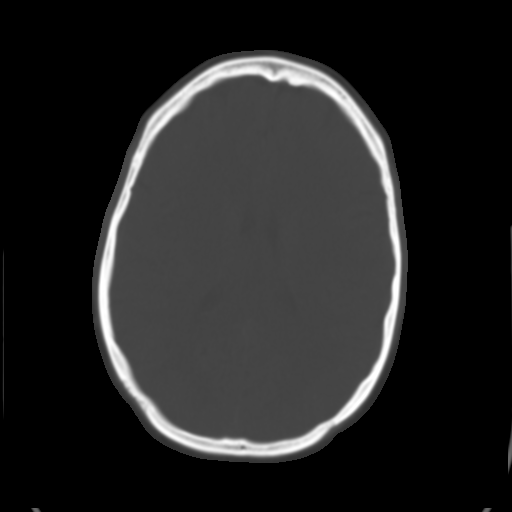
[im 20/33  brain]
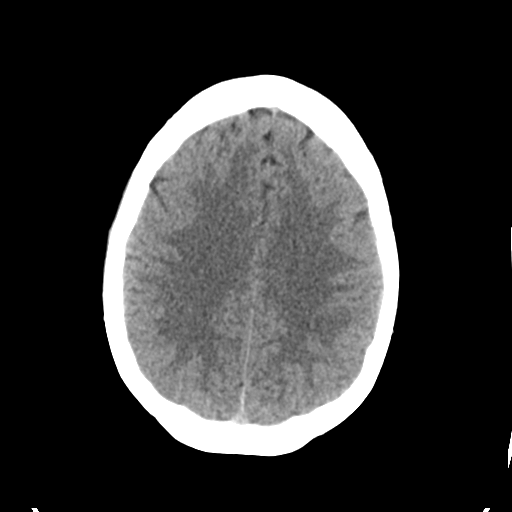
[im 24/33  brain]
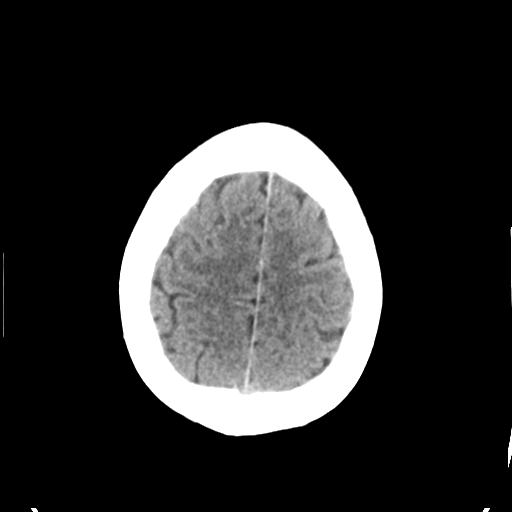
[im 27/33  brain]
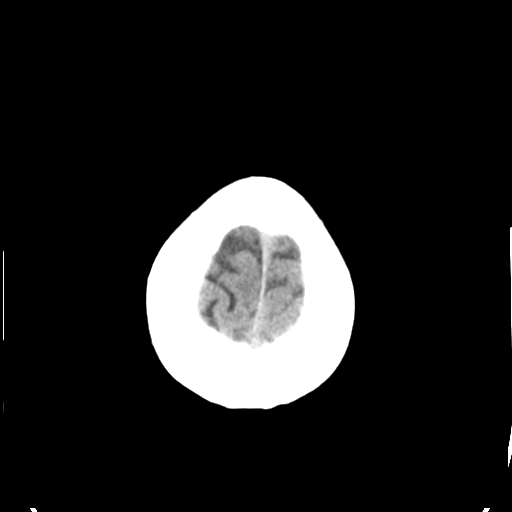
[im 30/33  brain]
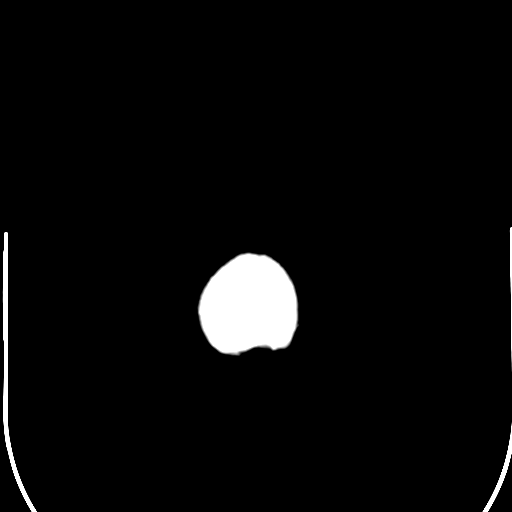
[im 30/33  bone]
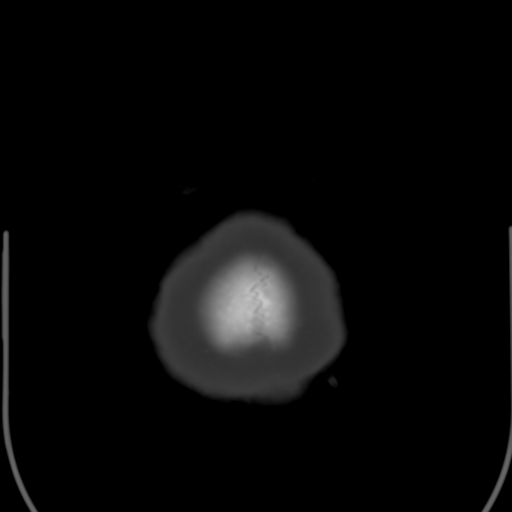

[Series 5: head 3.0 mpr cor · coronal · 0.31mm/px · 3 of 65 slices shown]
[im 22/65  brain]
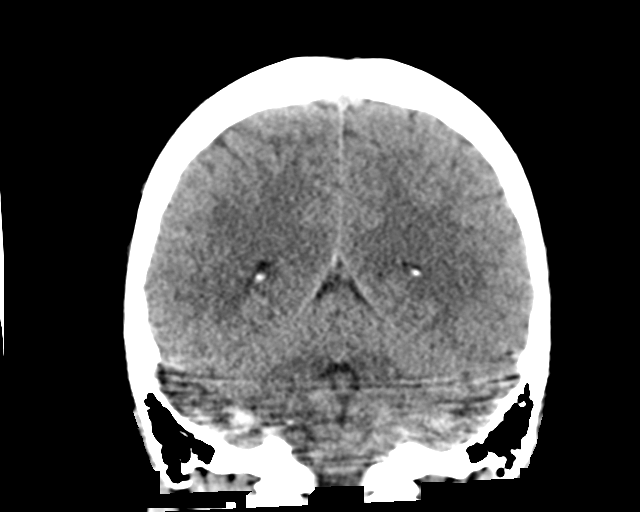
[im 29/65  brain]
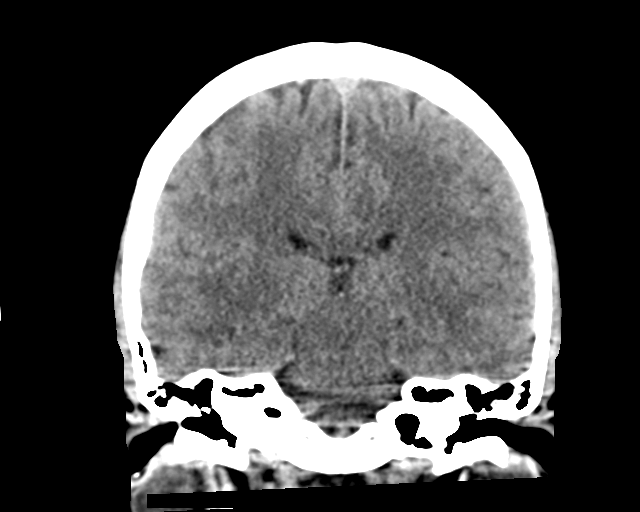
[im 36/65  brain]
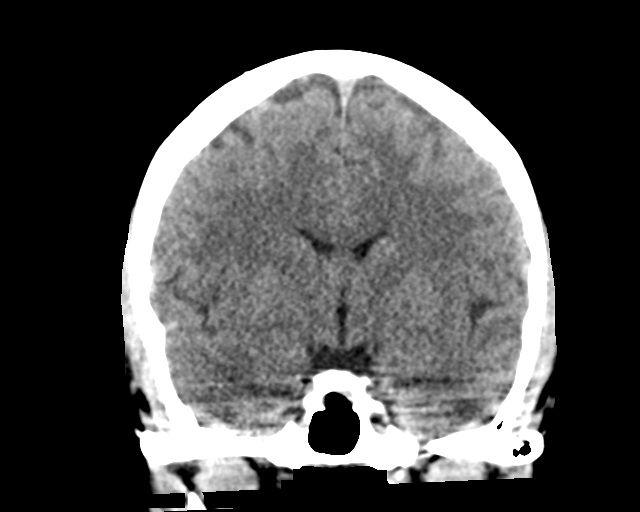

[Series 6: head 3.0 mpr sag · sagittal · 0.31mm/px · 3 of 53 slices shown]
[im 18/53  brain]
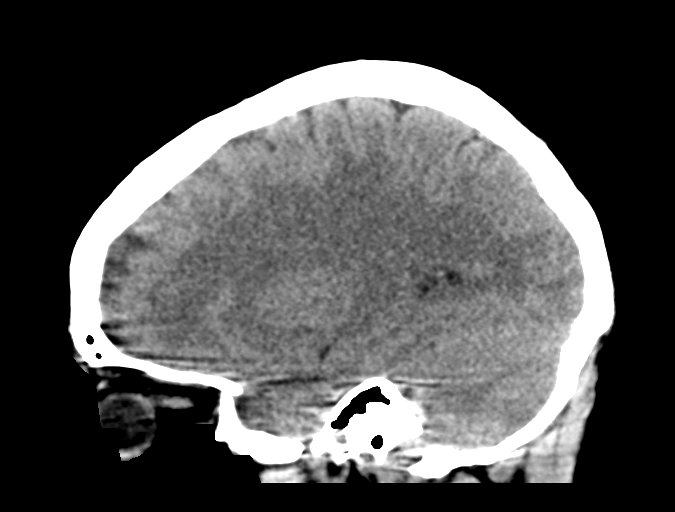
[im 27/53  brain]
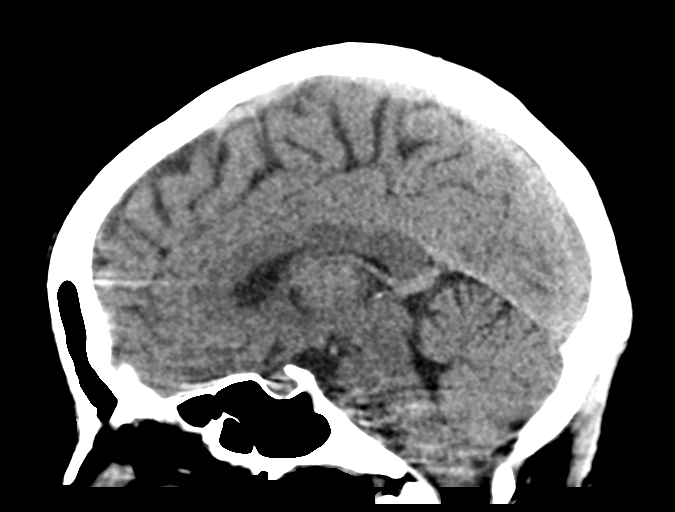
[im 35/53  brain]
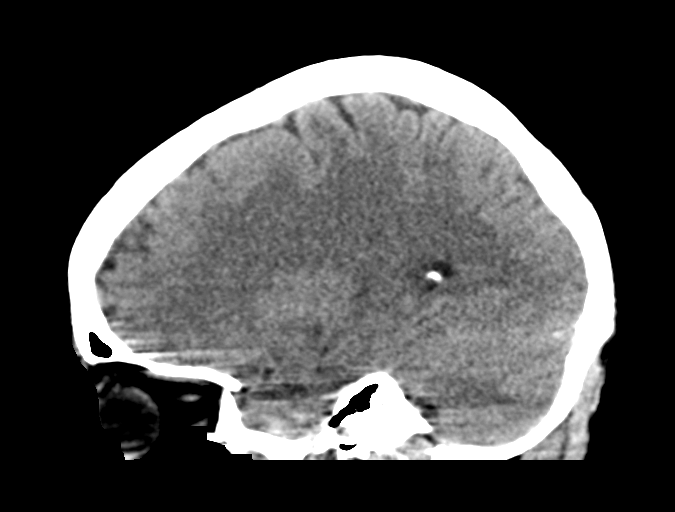

[15 of 47 positions shown; findings below may reference images not displayed]

FINDINGS: Brain: There is no mass, hemorrhage or extra-axial collection. The
size and configuration of the ventricles and extra-axial CSF spaces
are normal. The brain parenchyma is normal, without acute or chronic
infarction.

Vascular: No abnormal hyperdensity of the major intracranial
arteries or dural venous sinuses. No intracranial atherosclerosis.

Skull: The visualized skull base, calvarium and extracranial soft
tissues are normal.

Sinuses/Orbits: No fluid levels or advanced mucosal thickening of
the visualized paranasal sinuses. No mastoid or middle ear effusion.
The orbits are normal.
IMPRESSION: Normal head CT.
